# Patient Record
Sex: Male | Born: 1935 | Race: White | Hispanic: No | Marital: Married | Smoking: Former smoker
Health system: Southern US, Community
[De-identification: ages and names within clinical notes are randomized; demographics above are authoritative.]

## PROBLEM LIST (undated history)

## (undated) DIAGNOSIS — C801 Malignant (primary) neoplasm, unspecified: Secondary | ICD-10-CM

## (undated) DIAGNOSIS — I10 Essential (primary) hypertension: Secondary | ICD-10-CM

## (undated) HISTORY — PX: PACEMAKER INSERTION: SHX728

## (undated) HISTORY — PX: INSERT / REPLACE / REMOVE PACEMAKER: SUR710

---

## 2005-06-29 ENCOUNTER — Ambulatory Visit: Payer: Self-pay | Admitting: Internal Medicine

## 2005-07-14 ENCOUNTER — Ambulatory Visit: Payer: Self-pay | Admitting: Neurology

## 2005-07-17 ENCOUNTER — Ambulatory Visit: Payer: Self-pay | Admitting: Neurology

## 2006-02-22 ENCOUNTER — Ambulatory Visit: Payer: Self-pay | Admitting: Ophthalmology

## 2006-03-01 ENCOUNTER — Ambulatory Visit: Payer: Self-pay | Admitting: Ophthalmology

## 2006-03-16 ENCOUNTER — Ambulatory Visit: Payer: Self-pay | Admitting: Ophthalmology

## 2006-03-22 ENCOUNTER — Ambulatory Visit: Payer: Self-pay | Admitting: Ophthalmology

## 2008-09-07 DIAGNOSIS — C801 Malignant (primary) neoplasm, unspecified: Secondary | ICD-10-CM

## 2008-09-07 HISTORY — DX: Malignant (primary) neoplasm, unspecified: C80.1

## 2009-09-07 HISTORY — PX: OTHER SURGICAL HISTORY: SHX169

## 2009-11-05 ENCOUNTER — Ambulatory Visit: Payer: Self-pay | Admitting: Internal Medicine

## 2009-11-12 ENCOUNTER — Ambulatory Visit: Payer: Self-pay | Admitting: Internal Medicine

## 2009-12-06 ENCOUNTER — Ambulatory Visit: Payer: Self-pay | Admitting: Internal Medicine

## 2010-01-05 ENCOUNTER — Ambulatory Visit: Payer: Self-pay | Admitting: Internal Medicine

## 2010-02-05 ENCOUNTER — Ambulatory Visit: Payer: Self-pay | Admitting: Internal Medicine

## 2010-03-07 ENCOUNTER — Ambulatory Visit: Payer: Self-pay | Admitting: Internal Medicine

## 2010-04-01 ENCOUNTER — Ambulatory Visit: Payer: Self-pay | Admitting: Internal Medicine

## 2010-04-07 ENCOUNTER — Ambulatory Visit: Payer: Self-pay | Admitting: Internal Medicine

## 2010-05-08 ENCOUNTER — Ambulatory Visit: Payer: Self-pay | Admitting: Internal Medicine

## 2010-06-07 ENCOUNTER — Ambulatory Visit: Payer: Self-pay | Admitting: Internal Medicine

## 2010-07-08 ENCOUNTER — Ambulatory Visit: Payer: Self-pay | Admitting: Internal Medicine

## 2010-08-11 ENCOUNTER — Ambulatory Visit: Payer: Self-pay | Admitting: Internal Medicine

## 2010-09-07 ENCOUNTER — Ambulatory Visit: Payer: Self-pay | Admitting: Internal Medicine

## 2010-10-13 ENCOUNTER — Ambulatory Visit: Payer: Self-pay | Admitting: Internal Medicine

## 2010-11-06 ENCOUNTER — Ambulatory Visit: Payer: Self-pay | Admitting: Internal Medicine

## 2010-12-07 ENCOUNTER — Ambulatory Visit: Payer: Self-pay | Admitting: Internal Medicine

## 2011-01-02 ENCOUNTER — Ambulatory Visit: Payer: Self-pay | Admitting: Physician Assistant

## 2011-01-06 ENCOUNTER — Ambulatory Visit: Payer: Self-pay | Admitting: Internal Medicine

## 2011-02-12 ENCOUNTER — Ambulatory Visit: Payer: Self-pay | Admitting: Cardiology

## 2011-02-16 ENCOUNTER — Ambulatory Visit: Payer: Self-pay | Admitting: Radiation Oncology

## 2011-02-17 ENCOUNTER — Ambulatory Visit: Payer: Self-pay | Admitting: Cardiology

## 2011-02-26 ENCOUNTER — Ambulatory Visit: Payer: Self-pay | Admitting: Radiation Oncology

## 2011-03-08 ENCOUNTER — Ambulatory Visit: Payer: Self-pay | Admitting: Radiation Oncology

## 2013-03-08 ENCOUNTER — Ambulatory Visit: Payer: Self-pay | Admitting: Ophthalmology

## 2013-03-09 ENCOUNTER — Other Ambulatory Visit: Payer: Self-pay | Admitting: Ophthalmology

## 2013-03-09 LAB — SEDIMENTATION RATE: Erythrocyte Sed Rate: 2 mm/hr (ref 0–20)

## 2014-10-19 ENCOUNTER — Inpatient Hospital Stay: Payer: Self-pay | Admitting: Internal Medicine

## 2015-01-06 NOTE — H&P (Signed)
PATIENT NAME:  Marcus Cruz, Marcus Cruz MR#:  [id number] DATE OF BIRTH:  [birth year]  DATE OF ADMISSION:  10/19/2014   CHIEF COMPLAINT: Shortness of breath.   HISTORY OF PRESENT ILLNESS: A 81 year old Caucasian gentleman who is an extremely poor historian given tangential speech pattern, has history of essential hypertension, permanent pacemaker insertion, presenting with shortness of breath. Apparently, he has had shortness progressive over the last few weeks, with mainly as dyspnea on exertion. Denies any orthopnea, edema, or chest pain. Does have positive cough productive of clear sputum. No further symptomatology. No fevers, chills, or other symptoms. Upon arrival to the Emergency Department, noted to be hypoxic, saturations in the high 80s on room air with tachypnea.  REVIEW OF SYSTEMS: CONSTITUTIONAL: Denies fevers, chills, fatigue, weakness.  EYES: Denies blurred vision, double vision, or eye pain.  EARS AND NOSE AND THROAT: Denies tinnitus, ear pain, or hearing loss.  RESPIRATORY:  Positive for cough and shortness of breath as described above. CARDIOVASCULAR: Denies chest pain, palpitations, edema.  GASTROINTESTINAL: Denies nausea, vomiting, diarrhea, or abdominal pain.  GENITOURINARY:  Denies dysuria, hematuria. ENDOCRINE: Denies nocturia or thyroid problems.  HEMATOLOGIC AND LYMPHATIC: Denies easy bruising or bleeding.  SKIN: Denies rash or lesion.  MUSCULOSKELETAL: Denies pain in neck, back, shoulder, knees, hips or arthritic symptoms.  NEUROLOGIC: Denies paralysis or paresthesias. PSYCHIATRIC: Denies anxiety or depressive symptoms. Otherwise, full review of systems performed by me is negative.   PAST MEDICAL HISTORY: Includes essential hypertension, permanent pacemaker insertion as well as history of sarcoma status post treatment, chronic kidney disease, however unknown baseline.   SOCIAL HISTORY: Remote tobacco use. No alcohol or drug usage.   FAMILY HISTORY: Denies any known  cardiovascular problems or disorders.   ALLERGIES: No known drug allergies.   HOME MEDICATIONS: Include aspirin 81 mg p.o. daily, colchicine 0.6 mg p.o. 3 times daily as needed for gout, metoprolol succinate 25 mg p.o. daily, Lasix 20 mg p.o. daily.   PHYSICAL EXAMINATION: VITAL SIGNS: Temperature 99.1, heart rate 104, respirations 29, blood pressure 168/93, saturating 92% on supplemental O2, weight 80.3 kg, BMI 22.7.  GENERAL: Weak -appearing Caucasian gentleman, currently in no acute distress.  HEAD: Normocephalic, atraumatic.  EYES: Pupils equal, round, and  reactive to light. Extraocular muscles intact. No scleral icterus.  MOUTH: Moist mucosal membrane, dentition intact, no abscess noted. Marland Kitchen  EARS AND NOSE AND THROAT: Clear without exudates, no external lesions. NECK: Supple. No thyromegaly. No nodules. No JVD.  PULMONARY: Coarse breath sounds with scattered rhonchi mainly at the bases and more prominent on the right than on the left.  CHEST: Nontender to palpation.  CARDIOVASCULAR: S1, S2, regular rate and rhythm. No murmurs, rubs, or gallops. No edema. Pedal pulses 2+ bilaterally.  GASTROINTESTINAL: Soft, nontender, nondistended. No masses. Positive bowel sounds. No hepatosplenomegaly.  MUSCULOSKELETAL: No swelling, clubbing, or edema. Range of motion full in all extremities.  NEUROLOGIC: Cranial nerves II through XII intact. no gross focal neurological deficits, sensation intact, reflexes intact.   SKIN:  No ulceration, lesions, rashes, or cyanosis. Skin warm, dry, turgor intact.  PSYCHIATRIC: Mood and affect within normal range; awake, alert and oriented x 3 with tangential speech pattern. Insight and judgment intact.   DIAGNOSTIC DATA: EKG: Ventricular paced. Chest x-ray performed which reveals no acute cardiopulmonary process. Remainder of laboratory data: Sodium 139, potassium 4, chloride 106, bicarbonate 22, BUN 28, creatinine 1.93, glucose 150. Troponin is 0.07. WBC 19,  hemoglobin 15.4, and platelets are 97,000.   ASSESSMENT AND PLAN: A 81 year old  gentleman with history of essential hypertension, permanent pacemaker insertion and shortness of breath.   1. Acute on chronic hypoxic respiratory failure. Respiratory rates in the 30s. Oxygen saturations in the mid to high 80s on room air.  oxygen to keep saturations above  92%. DuoNeb treatments q.4 hours while she had azithromycin; however, has received Lasix in the Emergency Department. No further overt of congestive heart failure. Will hold further diuresis. Check a transthoracic echocardiogram.  2. Elevated troponin: Place on telemetry. Initiate aspirin and statin therapy. Trend cardiac enzymes x3; if upward trend we will initiate heparin drip.  3. Acute kidney injury on chronic kidney disease, unknown baseline but has elevated BUN. We will follow urine output and renal function. Will ask to hold on further diuresis at this time.  4. Hypertension. Continue  beta-blockade.  5. Deep vein thrombosis prophylaxis with heparin subcutaneously.   CODE STATUS: The patient is full code.   Time spent was 45 minutes.    ____________________________ Aaron Mose. Hower, MD dkh:dw D: 10/19/2014 20:58:35 ET T: 10/19/2014 21:13:50 ET JOB#: [id number]  cc: Aaron Mose. Hower, MD, <Dictator> DAVID Woodfin Ganja MD ELECTRONICALLY SIGNED 10/20/2014 2:12

## 2015-01-06 NOTE — Discharge Summary (Signed)
PATIENT NAME:  Marcus Cruz, APPLEGATE MR#:  [id number] DATE OF BIRTH:  Oct 24, 1920  DATE OF ADMISSION:  10/19/2014 DATE OF DISCHARGE:  10/24/2014  DISCHARGE DIAGNOSES: 1. Pneumonia, interstitial.  2. Acute respiratory failure, hypoxic, not previously on oxygen.  3. History of gout.   DISCHARGE MEDICATIONS: Per [facility] medication reconciliation sheet, but basically, he will be on his home medications and azithromycin 500 daily for 3 more days.   HISTORY AND PHYSICAL: Please see detailed history and physical done on admission.   HOSPITAL COURSE: The patient was admitted short of breath, weakness. Initial chest x-ray in the Emergency Room was unrevealing, and he was started on a Zithromax. Repeat chest x-ray several days later did show interstitial lung pneumonitis. White blood cell count was 19,000 on admission and increased gradually down to normal range and, in fact, was 8300 yesterday. He has known renal insufficiency that was worse on admission, with his GFR is at 35 on admission and increased with IV fluids, at 46 now. With this, was given a diagnosis of acute on chronic renal disease. He ambulated 300 feet yesterday with physical therapy. We will get him home health physical therapy, as he was weak and needed help do such and lives alone he will need nursing and physical therapy. I will see him in the office next week for further evaluation.   Note, it took approximately 35 minutes to do all discharge tasks today.   ____________________________ Ocie Cornfield. Ouida Sills, MD mwa:mw D: 10/24/2014 07:52:30 ET T: 10/24/2014 12:45:00 ET JOB#: [id number]  cc: Ocie Cornfield. Ouida Sills, MD, <Dictator> Kirk Ruths MD ELECTRONICALLY SIGNED 10/25/2014 8:11

## 2015-01-24 ENCOUNTER — Other Ambulatory Visit: Payer: Self-pay | Admitting: Urology

## 2015-01-24 DIAGNOSIS — R310 Gross hematuria: Secondary | ICD-10-CM

## 2015-01-30 ENCOUNTER — Ambulatory Visit
Admission: RE | Admit: 2015-01-30 | Discharge: 2015-01-30 | Disposition: A | Discharge status: Home / Self Care | Payer: Medicare Other | Source: Ambulatory Visit | Attending: Urology | Admitting: Urology

## 2015-01-30 DIAGNOSIS — R310 Gross hematuria: Secondary | ICD-10-CM | POA: Diagnosis present

## 2015-01-30 DIAGNOSIS — K4020 Bilateral inguinal hernia, without obstruction or gangrene, not specified as recurrent: Secondary | ICD-10-CM | POA: Diagnosis not present

## 2015-01-30 DIAGNOSIS — K449 Diaphragmatic hernia without obstruction or gangrene: Secondary | ICD-10-CM | POA: Diagnosis not present

## 2015-01-30 DIAGNOSIS — N281 Cyst of kidney, acquired: Secondary | ICD-10-CM | POA: Insufficient documentation

## 2015-01-30 DIAGNOSIS — K5900 Constipation, unspecified: Secondary | ICD-10-CM | POA: Insufficient documentation

## 2015-01-30 DIAGNOSIS — I714 Abdominal aortic aneurysm, without rupture: Secondary | ICD-10-CM | POA: Insufficient documentation

## 2015-01-30 DIAGNOSIS — K7689 Other specified diseases of liver: Secondary | ICD-10-CM | POA: Diagnosis not present

## 2015-01-30 HISTORY — DX: Essential (primary) hypertension: I10

## 2015-01-30 HISTORY — DX: Malignant (primary) neoplasm, unspecified: C80.1

## 2015-01-30 MED ORDER — IOHEXOL 300 MG/ML  SOLN
125.0000 mL | Freq: Once | INTRAMUSCULAR | Status: AC | PRN
  Administered 2015-01-30: 150 mL via INTRAVENOUS

## 2015-02-08 ENCOUNTER — Other Ambulatory Visit: Payer: Self-pay | Admitting: *Deleted

## 2015-02-08 DIAGNOSIS — N390 Urinary tract infection, site not specified: Secondary | ICD-10-CM

## 2015-02-08 DIAGNOSIS — R319 Hematuria, unspecified: Principal | ICD-10-CM

## 2015-02-08 MED ORDER — AMOXICILLIN-POT CLAVULANATE 875-125 MG PO TABS: 1.0000 | ORAL_TABLET | Freq: Two times a day (BID) | ORAL | Status: AC

## 2015-03-15 ENCOUNTER — Other Ambulatory Visit: Payer: Self-pay | Admitting: Urology

## 2017-03-04 ENCOUNTER — Inpatient Hospital Stay
Admission: EM | Admit: 2017-03-04 | Discharge: 2017-03-07 | DRG: 690 | Disposition: A | Discharge status: Home / Self Care | Payer: Medicare Other | Attending: Internal Medicine | Admitting: Internal Medicine

## 2017-03-04 ENCOUNTER — Emergency Department: Payer: Medicare Other

## 2017-03-04 ENCOUNTER — Encounter: Payer: Self-pay | Admitting: Emergency Medicine

## 2017-03-04 DIAGNOSIS — N10 Acute tubulo-interstitial nephritis: Secondary | ICD-10-CM | POA: Diagnosis not present

## 2017-03-04 DIAGNOSIS — Z8589 Personal history of malignant neoplasm of other organs and systems: Secondary | ICD-10-CM

## 2017-03-04 DIAGNOSIS — Z87891 Personal history of nicotine dependence: Secondary | ICD-10-CM | POA: Diagnosis not present

## 2017-03-04 DIAGNOSIS — D638 Anemia in other chronic diseases classified elsewhere: Secondary | ICD-10-CM | POA: Diagnosis present

## 2017-03-04 DIAGNOSIS — I129 Hypertensive chronic kidney disease with stage 1 through stage 4 chronic kidney disease, or unspecified chronic kidney disease: Secondary | ICD-10-CM | POA: Diagnosis present

## 2017-03-04 DIAGNOSIS — L988 Other specified disorders of the skin and subcutaneous tissue: Secondary | ICD-10-CM | POA: Diagnosis present

## 2017-03-04 DIAGNOSIS — Z7982 Long term (current) use of aspirin: Secondary | ICD-10-CM

## 2017-03-04 DIAGNOSIS — B9620 Unspecified Escherichia coli [E. coli] as the cause of diseases classified elsewhere: Secondary | ICD-10-CM | POA: Diagnosis present

## 2017-03-04 DIAGNOSIS — N400 Enlarged prostate without lower urinary tract symptoms: Secondary | ICD-10-CM | POA: Diagnosis present

## 2017-03-04 DIAGNOSIS — Z66 Do not resuscitate: Secondary | ICD-10-CM | POA: Diagnosis present

## 2017-03-04 DIAGNOSIS — D696 Thrombocytopenia, unspecified: Secondary | ICD-10-CM | POA: Diagnosis present

## 2017-03-04 DIAGNOSIS — R7881 Bacteremia: Secondary | ICD-10-CM | POA: Diagnosis present

## 2017-03-04 DIAGNOSIS — K5900 Constipation, unspecified: Secondary | ICD-10-CM | POA: Diagnosis present

## 2017-03-04 DIAGNOSIS — Z950 Presence of cardiac pacemaker: Secondary | ICD-10-CM

## 2017-03-04 DIAGNOSIS — R634 Abnormal weight loss: Secondary | ICD-10-CM | POA: Diagnosis present

## 2017-03-04 DIAGNOSIS — N12 Tubulo-interstitial nephritis, not specified as acute or chronic: Secondary | ICD-10-CM | POA: Diagnosis present

## 2017-03-04 DIAGNOSIS — R112 Nausea with vomiting, unspecified: Secondary | ICD-10-CM | POA: Diagnosis present

## 2017-03-04 DIAGNOSIS — N179 Acute kidney failure, unspecified: Secondary | ICD-10-CM | POA: Diagnosis present

## 2017-03-04 DIAGNOSIS — Z79899 Other long term (current) drug therapy: Secondary | ICD-10-CM

## 2017-03-04 DIAGNOSIS — Z8249 Family history of ischemic heart disease and other diseases of the circulatory system: Secondary | ICD-10-CM

## 2017-03-04 DIAGNOSIS — N183 Chronic kidney disease, stage 3 (moderate): Secondary | ICD-10-CM | POA: Diagnosis present

## 2017-03-04 LAB — COMPREHENSIVE METABOLIC PANEL
ALBUMIN: 3.7 g/dL (ref 3.5–5.0)
ALT: 9 U/L — ABNORMAL LOW (ref 17–63)
AST: 18 U/L (ref 15–41)
Alkaline Phosphatase: 70 U/L (ref 38–126)
Anion gap: 9 (ref 5–15)
BILIRUBIN TOTAL: 1.4 mg/dL — AB (ref 0.3–1.2)
BUN: 28 mg/dL — AB (ref 6–20)
CALCIUM: 8.8 mg/dL — AB (ref 8.9–10.3)
CO2: 24 mmol/L (ref 22–32)
Chloride: 104 mmol/L (ref 101–111)
Creatinine, Ser: 1.59 mg/dL — ABNORMAL HIGH (ref 0.61–1.24)
GFR calc Af Amer: 41 mL/min — ABNORMAL LOW (ref >60)
GFR, EST NON AFRICAN AMERICAN: 35 mL/min — AB (ref >60)
GLUCOSE: 129 mg/dL — AB (ref 65–99)
Potassium: 4.1 mmol/L (ref 3.5–5.1)
Sodium: 137 mmol/L (ref 135–145)
TOTAL PROTEIN: 6.7 g/dL (ref 6.5–8.1)

## 2017-03-04 LAB — URINALYSIS, COMPLETE (UACMP) WITH MICROSCOPIC
Bacteria, UA: NONE SEEN
Bilirubin Urine: NEGATIVE
GLUCOSE, UA: NEGATIVE mg/dL
Ketones, ur: NEGATIVE mg/dL
NITRITE: NEGATIVE
PH: 5 (ref 5.0–8.0)
PROTEIN: 100 mg/dL — AB
Specific Gravity, Urine: 1.024 (ref 1.005–1.030)
Squamous Epithelial / LPF: NONE SEEN

## 2017-03-04 LAB — CBC
HCT: 36.5 % — ABNORMAL LOW (ref 40.0–52.0)
Hemoglobin: 12.6 g/dL — ABNORMAL LOW (ref 13.0–18.0)
MCH: 30.7 pg (ref 26.0–34.0)
MCHC: 34.5 g/dL (ref 32.0–36.0)
MCV: 88.9 fL (ref 80.0–100.0)
Platelets: 104 10*3/uL — ABNORMAL LOW (ref 150–440)
RBC: 4.1 MIL/uL — ABNORMAL LOW (ref 4.40–5.90)
RDW: 15.1 % — AB (ref 11.5–14.5)
WBC: 7.6 10*3/uL (ref 3.8–10.6)

## 2017-03-04 LAB — LIPASE, BLOOD: Lipase: 23 U/L (ref 11–51)

## 2017-03-04 LAB — LACTIC ACID, PLASMA: Lactic Acid, Venous: 1.2 mmol/L (ref 0.5–1.9)

## 2017-03-04 MED ORDER — ACETAMINOPHEN 325 MG PO TABS
650.0000 mg | ORAL_TABLET | Freq: Four times a day (QID) | ORAL | Status: DC | PRN
  Administered 2017-03-05: 650 mg via ORAL
  Filled 2017-03-04: qty 2

## 2017-03-04 MED ORDER — METOPROLOL SUCCINATE ER 25 MG PO TB24
25.0000 mg | ORAL_TABLET | Freq: Every day | ORAL | Status: DC
  Administered 2017-03-05 – 2017-03-07 (×3): 25 mg via ORAL
  Filled 2017-03-04 (×3): qty 1

## 2017-03-04 MED ORDER — TAMSULOSIN HCL 0.4 MG PO CAPS
0.4000 mg | ORAL_CAPSULE | Freq: Every day | ORAL | Status: DC
  Administered 2017-03-04 – 2017-03-07 (×4): 0.4 mg via ORAL
  Filled 2017-03-04 (×4): qty 1

## 2017-03-04 MED ORDER — IOPAMIDOL (ISOVUE-300) INJECTION 61%: 30.0000 mL | Freq: Once | INTRAVENOUS | Status: DC

## 2017-03-04 MED ORDER — ENOXAPARIN SODIUM 40 MG/0.4ML ~~LOC~~ SOLN: 40.0000 mg | SUBCUTANEOUS | Status: DC

## 2017-03-04 MED ORDER — ASPIRIN EC 81 MG PO TBEC
81.0000 mg | DELAYED_RELEASE_TABLET | Freq: Every day | ORAL | Status: DC
  Administered 2017-03-05 – 2017-03-07 (×3): 81 mg via ORAL
  Filled 2017-03-04 (×3): qty 1

## 2017-03-04 MED ORDER — POLYETHYLENE GLYCOL 3350 17 G PO PACK
17.0000 g | PACK | Freq: Every day | ORAL | Status: DC
  Administered 2017-03-04 – 2017-03-07 (×4): 17 g via ORAL
  Filled 2017-03-04 (×4): qty 1

## 2017-03-04 MED ORDER — ACETAMINOPHEN 650 MG RE SUPP
650.0000 mg | Freq: Four times a day (QID) | RECTAL | Status: DC | PRN
Start: 2017-03-04 — End: 2017-03-07

## 2017-03-04 MED ORDER — ENOXAPARIN SODIUM 30 MG/0.3ML ~~LOC~~ SOLN
30.0000 mg | Freq: Every day | SUBCUTANEOUS | Status: DC
  Administered 2017-03-04 – 2017-03-06 (×3): 30 mg via SUBCUTANEOUS
  Filled 2017-03-04 (×3): qty 0.3

## 2017-03-04 MED ORDER — ONDANSETRON HCL 4 MG/2ML IJ SOLN
4.0000 mg | Freq: Once | INTRAMUSCULAR | Status: AC
  Administered 2017-03-04: 4 mg via INTRAVENOUS
  Filled 2017-03-04: qty 2

## 2017-03-04 MED ORDER — DEXTROSE 5 % IV SOLN
1.0000 g | Freq: Once | INTRAVENOUS | Status: AC
  Administered 2017-03-04: 1 g via INTRAVENOUS
  Filled 2017-03-04: qty 10

## 2017-03-04 MED ORDER — IOPAMIDOL (ISOVUE-300) INJECTION 61%
100.0000 mL | Freq: Once | INTRAVENOUS | Status: AC | PRN
  Administered 2017-03-04: 80 mL via INTRAVENOUS

## 2017-03-04 MED ORDER — BISACODYL 10 MG RE SUPP: 10.0000 mg | Freq: Every day | RECTAL | Status: DC | PRN

## 2017-03-04 MED ORDER — SODIUM CHLORIDE 0.9 % IV SOLN
INTRAVENOUS | Status: DC
  Administered 2017-03-04: 23:00:00 via INTRAVENOUS

## 2017-03-04 MED ORDER — DEXTROSE 5 % IV SOLN
1.0000 g | Freq: Every day | INTRAVENOUS | Status: DC
  Administered 2017-03-05: 1 g via INTRAVENOUS
  Filled 2017-03-04 (×3): qty 10

## 2017-03-04 MED ORDER — SODIUM CHLORIDE 0.9 % IV BOLUS (SEPSIS)
1000.0000 mL | Freq: Once | INTRAVENOUS | Status: AC
  Administered 2017-03-04: 1000 mL via INTRAVENOUS

## 2017-03-04 MED ORDER — ONDANSETRON HCL 4 MG/2ML IJ SOLN: 4.0000 mg | Freq: Four times a day (QID) | INTRAMUSCULAR | Status: DC | PRN

## 2017-03-04 MED ORDER — ONDANSETRON HCL 4 MG PO TABS: 4.0000 mg | ORAL_TABLET | Freq: Four times a day (QID) | ORAL | Status: DC | PRN

## 2017-03-04 NOTE — ED Triage Notes (Signed)
Patient presents to the ED with vomiting x 2.5 weeks.  Patient reports the vomiting began after patient started taking an antibiotic for his UTI.  Patient went to his PCP and they changed patient's antibiotics but patient has been continuing to have vomiting.  Patient reports vomiting approx. 7 times in the past 24 hours.  Patient denies diarrhea and abdominal pain.  Patient reports some constipation.  Patient reports having a bm yesterday--patient states, "I had to dig it out."

## 2017-03-04 NOTE — ED Notes (Signed)
Placed patient on 2 L [place] due to O2 at 90-92% RA. Now 96% on 2L

## 2017-03-04 NOTE — H&P (Addendum)
[place] [facility] [place] NAME: Marcus Cruz    MR#:  [id number]  DATE OF BIRTH:  Nov 13, 1920  DATE OF ADMISSION:  03/04/2017  PRIMARY CARE PHYSICIAN: Kirk Ruths, MD   REQUESTING/REFERRING PHYSICIAN: Dr Shirlyn Goltz  CHIEF COMPLAINT:   Chief Complaint  Patient presents with  . Emesis  . Weakness    HISTORY OF PRESENT ILLNESS:  Marcus Cruz  is a 83 y.o. male with a known history of Hypertension presents to the hospital with not feeling well for the last 2 weeks. He previously went to a walk-in clinic and was taking Augmentin for urinary tract infection. Over the last for 5 days he has been having nausea vomiting. He's been feeling very weak. He's been feeling cold. No fever or chills. He's had some weight loss from 156 down 149 pounds.  Hospitalist services were contacted for admission for pyelonephritis.  PAST MEDICAL HISTORY:   Past Medical History:  Diagnosis Date  . Cancer [facility]) 2010   sarcoma resected from Left thigh.  . Hypertension     PAST SURGICAL HISTORY:   Past Surgical History:  Procedure Laterality Date  . INSERT / REPLACE / REMOVE PACEMAKER    . PACEMAKER INSERTION    . sarcoma surgery Left 2011    SOCIAL HISTORY:   Social History  Substance Use Topics  . Smoking status: Former Smoker    Quit date: 2008  . Smokeless tobacco: Never Used  . Alcohol use Yes     Comment: rarely    FAMILY HISTORY:   Family History  Problem Relation Age of Onset  . CAD Father     DRUG ALLERGIES:  No Known Allergies  REVIEW OF SYSTEMS:  CONSTITUTIONAL: No fever. Positive for weakness. Positive for weight loss EYES: No blurred or double vision. Wears glasses EARS, NOSE, AND THROAT: No tinnitus or ear pain. No sore throat. Constant runny nose. Decreased hearing. RESPIRATORY: No cough, shortness of breath, wheezing or hemoptysis.  CARDIOVASCULAR: No chest pain, orthopnea, edema.  GASTROINTESTINAL: Positive for  nausea and vomiting. No diarrhea or abdominal pain. No blood in bowel movements. Positive for constipation GENITOURINARY: No dysuria, hematuria. Sometimes has trouble getting his urine out ENDOCRINE: No polyuria, nocturia,  HEMATOLOGY: No anemia, easy bruising or bleeding SKIN: No rash or lesion. MUSCULOSKELETAL: No joint pain or arthritis.   NEUROLOGIC: No tingling, numbness, weakness.  PSYCHIATRY: No anxiety or depression.   MEDICATIONS AT HOME:   Prior to Admission medications   Medication Sig Start Date End Date Taking? Authorizing Provider  aspirin EC 81 MG tablet Take 81 mg by mouth daily.   Yes [provider]  furosemide (LASIX) 20 MG tablet Take 20 mg by mouth daily. 03/03/17  Yes [provider]  metoprolol succinate (TOPROL-XL) 25 MG 24 hr tablet Take 25 mg by mouth daily. 03/03/17  Yes [provider]      VITAL SIGNS:  Blood pressure (!) 141/57, pulse 84, temperature 99.1 F (37.3 C), temperature source Oral, resp. rate 18, height 6' 2.5" (1.892 m), weight 67.6 kg (149 lb), SpO2 94 %.  PHYSICAL EXAMINATION:  GENERAL:  83 y.o.-year-old patient lying in the bed with no acute distress.  EYES: Pupils equal, round, reactive to light and accommodation. No scleral icterus. Extraocular muscles intact.  HEENT: Head atraumatic, normocephalic. Oropharynx and nasopharynx clear.  NECK:  Supple, no jugular venous distention. No thyroid enlargement, no tenderness.  LUNGS: Normal breath sounds bilaterally, no wheezing, rales,rhonchi or  crepitation. No use of accessory muscles of respiration.  CARDIOVASCULAR: S1, S2 normal. No murmurs, rubs, or gallops.  ABDOMEN: Soft, nontender, nondistended. Bowel sounds present. No organomegaly or mass. No CVA tenderness EXTREMITIES: No pedal edema, cyanosis, or clubbing.  NEUROLOGIC: Cranial nerves II through XII are intact except for decreased hearing. Muscle strength 5/5 in all extremities. Sensation intact. Gait not  checked.  PSYCHIATRIC: The patient is alert and oriented x 3.  SKIN: Left cheek he does have an area that looks suspicious for basal cell skin cancer  LABORATORY PANEL:   CBC  Recent Labs Lab 03/04/17 1432  WBC 7.6  HGB 12.6*  HCT 36.5*  PLT 104*   ------------------------------------------------------------------------------------------------------------------  Chemistries   Recent Labs Lab 03/04/17 1432  NA 137  K 4.1  CL 104  CO2 24  GLUCOSE 129*  BUN 28*  CREATININE 1.59*  CALCIUM 8.8*  AST 18  ALT 9*  ALKPHOS 70  BILITOT 1.4*   ------------------------------------------------------------------------------------------------------------------   RADIOLOGY:  Dg Chest 2 View  Result Date: 03/04/2017 CLINICAL DATA:  Vomiting for the past 2.5 weeks.  Ex-smoker. EXAM: CHEST  2 VIEW COMPARISON:  10/22/2014. FINDINGS: Normal sized heart with an interval decrease in size. Tortuous and partially calcified thoracic aorta. Stable mildly elevated right hemidiaphragm. Mild hyperexpansion of the lungs with mild central peribronchial thickening. Thoracic spine degenerative changes. Diffuse osteopenia. Stable left subclavian pacemaker leads. IMPRESSION: No acute abnormality.  Mild changes of COPD and chronic bronchitis. Electronically Signed   By: Claudie Revering M.D.   On: 03/04/2017 17:12   Ct Abdomen Pelvis W Contrast  Result Date: 03/04/2017 CLINICAL DATA:  2-1/2 week history of vomiting. EXAM: CT ABDOMEN AND PELVIS WITH CONTRAST TECHNIQUE: Multidetector CT imaging of the abdomen and pelvis was performed using the standard protocol following bolus administration of intravenous contrast. CONTRAST:  33mL ISOVUE-300 IOPAMIDOL (ISOVUE-300) INJECTION 61% COMPARISON:  01/30/2015 FINDINGS: Lower chest:  Emphysema with basilar atelectasis or scarring. Hepatobiliary: Multiple hepatic cysts of varying sizes again noted. A dominant cyst in the posterior right liver on the previous study shows  interval involution. There is no evidence for gallstones, gallbladder wall thickening, or pericholecystic fluid. No intrahepatic or extrahepatic biliary dilation. Pancreas: No focal mass lesion. No dilatation of the main duct. No intraparenchymal cyst. No peripancreatic edema. Spleen: No splenomegaly. No focal mass lesion. Adrenals/Urinary Tract: No adrenal nodule or mass. Cortical atrophy noted in both kidneys. Subtle area of altered segmental perfusion in the lateral aspect of the interpolar right kidney (image 12 series 7). 2.8 cm exophytic lesion in the interpolar left kidney has attenuation too high to represent a simple cyst but is unchanged since 01/30/2015 suggesting benign etiology such as cyst complicated by proteinaceous debris or hemorrhage. 19 mm exophytic lesion posterior left interpolar kidney was 17 mm previously. Small exophytic lesions lower pole right kidney are stable. No hydroureteronephrosis. No evidence for hydroureter. Bladder is distended with stable appearance of a polypoid nodular lesion along the wall of the bladder base, at an apparent TURP defect. This nodular area is stable since the prior study. Stomach/Bowel: Small hiatal hernia. Stomach is nondistended. No gastric wall thickening. No evidence of outlet obstruction. Duodenum is normally positioned as is the ligament of Treitz. No small bowel wall thickening. No small bowel dilatation. The terminal ileum is normal. The appendix is normal. Diverticular changes are noted in the left colon without evidence of diverticulitis. Short segment proximal sigmoid colon extends into a left groin hernia without evidence for obstruction or incarceration  by CT. Vascular/Lymphatic: Atherosclerotic calcification noted in the wall of the abdominal aorta which measures up to 3.3 cm maximum infrarenal diameter. There is no gastrohepatic or hepatoduodenal ligament lymphadenopathy. No intraperitoneal or retroperitoneal lymphadenopathy. No pelvic sidewall  lymphadenopathy. Reproductive: The prostate gland and seminal vesicles have normal imaging features. Other: No intraperitoneal free fluid. Musculoskeletal: As mentioned above, left groin hernia contains short segment proximal sigmoid colon without complicating features. Right groin hernia contains fat and a trace amount of fluid. Small sclerotic focus in the upper sacrum is stable. Bone windows reveal no worrisome lytic or sclerotic osseous lesions. Mild superior endplate compression deformity is evident at T12. IMPRESSION: 1. Focal area of altered perfusion in the interpolar right kidney suggests segmental edema. Pyelonephritis is suspected. 2. Atherosclerotic infrarenal fusiform abdominal aortic aneurysm measuring up to 3.3 cm. Recommend followup by ultrasound in 3 years. This recommendation follows ACR consensus guidelines: White Paper of the ACR Incidental Findings Committee II on Vascular Findings. J Am Coll Radiol 2013; 10:789-794. 3. Bilateral groin hernias. Left groin hernia contains short segment sigmoid colon without complicating features. Right groin hernia contains fat and a trace amount of free fluid. 4. Hepatic and renal cysts. Electronically Signed   By: Misty Stanley M.D.   On: 03/04/2017 18:32     IMPRESSION AND PLAN:   1. Acute pyelonephritis. IV Rocephin ordered. Follow-up urine culture. IV fluid hydration 2. Acute kidney injury on chronic kidney disease stage III. Monitor closely with gentle IV fluid hydration 3. BPH. Start Flomax. 4. Essential hypertension. Continue Toprol and hold Lasix. 5. Nausea and vomiting. When necessary Zofran 6. Thrombocytopenia. Looks chronic looking back at the current total clinic labs. Check a hepatitis C profile. 7. Lesion on his left face that is suspicious for basal cell skin cancer. Recommend dermatology appointment as outpatient once infection treated 8. Constipation give MiraLAX and Dulcolax suppositories as needed    All the records are  reviewed and case discussed with ED provider. Management plans discussed with the patient, family and they are in agreement.  CODE STATUS: DO NOT RESUSCITATE  TOTAL TIME TAKING CARE OF THIS PATIENT: 55 minutes.    Loletha Grayer M.D on 03/04/2017 at 8:33 PM  Between 7am to 6pm - Pager - [phone]  After 6pm call admission pager [phone]  [facility]  [phone]  CC: Primary care physician; Kirk Ruths, MD

## 2017-03-04 NOTE — ED Notes (Signed)
Patient reports since starting the antibiotic he has been vomiting. Patient st he cannot keep anything down for the last 2 days.

## 2017-03-04 NOTE — Progress Notes (Signed)
Lovenox changed to 30 mg daily for BMI <40 and CrCl <30 

## 2017-03-04 NOTE — ED Notes (Signed)
Patient has not vomited since being back in room ED13.

## 2017-03-04 NOTE — ED Notes (Signed)
Patient was brought to the ED by [facility].  Per staff patient started on augmentin on June 8th for a UTI and it caused him to have vomiting.  Patient has continued to take the augmentin and reports he has one dose left but he has continued to have vomiting and patient told his family today he was ready to go to the doctor.  EMS came to patient's home today and instructed patient to go to the ED and patient went to the walk-in clinic.

## 2017-03-04 NOTE — ED Provider Notes (Signed)
[place] Provider Note   CSN: [id number] Arrival date & time: 03/04/17  1334     History   Chief Complaint Chief Complaint  Patient presents with  . Emesis  . Weakness    HPI Marcus Cruz. is a 83 y.o. 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 Patient was diagnosed with urinary tract infection about 2 weeks ago and was started on Augmentin. Patient states that he has been having poor appetite for the last 2 weeks. About 10 pounds over the last 2 weeks. States that he was unable to keep anything down for the last 2 days and has been having persistent vomiting. He has some chills but no actual fever. Patient has been constipated but that is baseline for him. A perform digital rectal disimpaction himself yesterday was able to have a bowel movement. Has diffuse weakness but denies any productive cough.  The history is provided by the patient.    Past Medical History:  Diagnosis Date  . 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  . Hypertension     There are no active problems to display for this patient.   Past Surgical History:  Procedure Laterality Date  . PACEMAKER INSERTION    . sarcoma surgery Left 2011       Home Medications    Prior to Admission medications   Not on File    Family History No family history on file.  Social History Social History  Substance Use Topics  . Smoking status: Former Smoker    Quit date: 2008  . Smokeless tobacco: Never Used  . Alcohol use Yes     Comment: rarely     Allergies   Patient has no known allergies.   Review of Systems Review of Systems  Constitutional: Positive for chills.  Gastrointestinal: Positive for abdominal pain, constipation and vomiting.  Genitourinary: Positive for decreased urine volume.  Neurological: Positive for weakness.  All other systems reviewed and are negative.    Physical  Exam Updated Vital Signs BP (!) 141/57 (BP Location: Right Arm)   Pulse 84   Temp 99.1 F (37.3 C) (Oral)   Resp 18   Ht 6' 2.5" (1.892 m)   Wt 67.6 kg (149 lb)   SpO2 94%   BMI 18.87 kg/m   Physical Exam  Constitutional: He is oriented to person, place, and time.  Uncomfortable, dehydrated   HENT:  Head: Normocephalic.  MM slightly dry   Eyes: Conjunctivae and EOM are normal. Pupils are equal, round, and reactive to light.  Neck: Normal range of motion. Neck supple.  Cardiovascular: Normal rate, regular rhythm and normal heart sounds.   Pulmonary/Chest: Effort normal.  Crackles R base   Abdominal: Soft. Bowel sounds are normal.  Mild R CVAT   Genitourinary:  Genitourinary Comments: Rectal- no obvious stool impaction   Musculoskeletal: Normal range of motion. He exhibits no edema.  Neurological: He is alert and oriented to person, place, and time. No cranial nerve deficit.  Skin: Skin is warm.  Psychiatric: He has a normal mood and affect.  Nursing note and vitals reviewed.    ED Treatments / Results  Labs (all labs ordered are listed, but only abnormal results are displayed) Labs Reviewed  COMPREHENSIVE METABOLIC PANEL - Abnormal; Notable for the following:       Result Value   Glucose, Bld 129 (*)    BUN 28 (*)  Creatinine, Ser 1.59 (*)    Calcium 8.8 (*)    ALT 9 (*)    Total Bilirubin 1.4 (*)    GFR calc non Af Amer 35 (*)    GFR calc Af Amer 41 (*)    All other components within normal limits  CBC - Abnormal; Notable for the following:    RBC 4.10 (*)    Hemoglobin 12.6 (*)    HCT 36.5 (*)    RDW 15.1 (*)    Platelets 104 (*)    All other components within normal limits  URINALYSIS, COMPLETE (UACMP) WITH MICROSCOPIC - Abnormal; Notable for the following:    Color, Urine YELLOW (*)    APPearance TURBID (*)    Hgb urine dipstick MODERATE (*)    Protein, ur 100 (*)    Leukocytes, UA LARGE (*)    All other components within normal limits  CULTURE,  BLOOD (ROUTINE X 2)  CULTURE, BLOOD (ROUTINE X 2)  LIPASE, BLOOD  LACTIC ACID, PLASMA    EKG  EKG Interpretation None       Radiology Dg Chest 2 View  Result Date: 03/04/2017 CLINICAL DATA:  Vomiting for the past 2.5 weeks.  Ex-smoker. EXAM: CHEST  2 VIEW COMPARISON:  10/22/2014. FINDINGS: Normal sized heart with an interval decrease in size. Tortuous and partially calcified thoracic aorta. Stable mildly elevated right hemidiaphragm. Mild hyperexpansion of the lungs with mild central peribronchial thickening. Thoracic spine degenerative changes. Diffuse osteopenia. Stable left subclavian pacemaker leads. IMPRESSION: No acute abnormality.  Mild changes of COPD and chronic bronchitis. Electronically Signed   By: Claudie Revering M.D.   On: 03/04/2017 17:12   Ct Abdomen Pelvis W Contrast  Result Date: 03/04/2017 CLINICAL DATA:  2-1/2 week history of vomiting. EXAM: CT ABDOMEN AND PELVIS WITH CONTRAST TECHNIQUE: Multidetector CT imaging of the abdomen and pelvis was performed using the standard protocol following bolus administration of intravenous contrast. CONTRAST:  64mL ISOVUE-300 IOPAMIDOL (ISOVUE-300) INJECTION 61% COMPARISON:  01/30/2015 FINDINGS: Lower chest:  Emphysema with basilar atelectasis or scarring. Hepatobiliary: Multiple hepatic cysts of varying sizes again noted. A dominant cyst in the posterior right liver on the previous study shows interval involution. There is no evidence for gallstones, gallbladder wall thickening, or pericholecystic fluid. No intrahepatic or extrahepatic biliary dilation. Pancreas: No focal mass lesion. No dilatation of the main duct. No intraparenchymal cyst. No peripancreatic edema. Spleen: No splenomegaly. No focal mass lesion. Adrenals/Urinary Tract: No adrenal nodule or mass. Cortical atrophy noted in both kidneys. Subtle area of altered segmental perfusion in the lateral aspect of the interpolar right kidney (image 12 series 7). 2.8 cm exophytic lesion in  the interpolar left kidney has attenuation too high to represent a simple cyst but is unchanged since 01/30/2015 suggesting benign etiology such as cyst complicated by proteinaceous debris or hemorrhage. 19 mm exophytic lesion posterior left interpolar kidney was 17 mm previously. Small exophytic lesions lower pole right kidney are stable. No hydroureteronephrosis. No evidence for hydroureter. Bladder is distended with stable appearance of a polypoid nodular lesion along the wall of the bladder base, at an apparent TURP defect. This nodular area is stable since the prior study. Stomach/Bowel: Small hiatal hernia. Stomach is nondistended. No gastric wall thickening. No evidence of outlet obstruction. Duodenum is normally positioned as is the ligament of Treitz. No small bowel wall thickening. No small bowel dilatation. The terminal ileum is normal. The appendix is normal. Diverticular changes are noted in the left colon without evidence of  diverticulitis. Short segment proximal sigmoid colon extends into a left groin hernia without evidence for obstruction or incarceration by CT. Vascular/Lymphatic: Atherosclerotic calcification noted in the wall of the abdominal aorta which measures up to 3.3 cm maximum infrarenal diameter. There is no gastrohepatic or hepatoduodenal ligament lymphadenopathy. No intraperitoneal or retroperitoneal lymphadenopathy. No pelvic sidewall lymphadenopathy. Reproductive: The prostate gland and seminal vesicles have normal imaging features. Other: No intraperitoneal free fluid. Musculoskeletal: As mentioned above, left groin hernia contains short segment proximal sigmoid colon without complicating features. Right groin hernia contains fat and a trace amount of fluid. Small sclerotic focus in the upper sacrum is stable. Bone windows reveal no worrisome lytic or sclerotic osseous lesions. Mild superior endplate compression deformity is evident at T12. IMPRESSION: 1. Focal area of altered  perfusion in the interpolar right kidney suggests segmental edema. Pyelonephritis is suspected. 2. Atherosclerotic infrarenal fusiform abdominal aortic aneurysm measuring up to 3.3 cm. Recommend followup by ultrasound in 3 years. This recommendation follows ACR consensus guidelines: White Paper of the ACR Incidental Findings Committee II on Vascular Findings. J Am Coll Radiol 2013; 10:789-794. 3. Bilateral groin hernias. Left groin hernia contains short segment sigmoid colon without complicating features. Right groin hernia contains fat and a trace amount of free fluid. 4. Hepatic and renal cysts. Electronically Signed   By: Misty Stanley M.D.   On: 03/04/2017 18:32    Procedures Procedures (including critical care time)  Medications Ordered in ED Medications  iopamidol (ISOVUE-300) 61 % injection 30 mL (not administered)  cefTRIAXone (ROCEPHIN) 1 g in dextrose 5 % 50 mL IVPB (not administered)  sodium chloride 0.9 % bolus 1,000 mL (1,000 mLs Intravenous New Bag/Given 03/04/17 1639)  ondansetron (ZOFRAN) injection 4 mg (4 mg Intravenous Given 03/04/17 1639)  iopamidol (ISOVUE-300) 61 % injection 100 mL (80 mLs Intravenous Contrast Given 03/04/17 1759)     Initial Impression / Assessment and Plan / ED Course  I have reviewed the triage vital signs and the nursing notes.  Pertinent labs & imaging results that were available during my care of the patient were reviewed by me and considered in my medical decision making (see chart for details).     Marcus Cruz. is a 83 y.o. male here with weight loss, chills, abdominal pain, vomiting. Consider pyelo vs pneumonia vs progressive sarcoma. Will get labs, UA, lactate, cultures, CXR, CT ab/pel. Will hydrate patient.   7:58 PM UA + large leuk and too many to count WBC. CT showed R pyelo. No urine culture in the system. Urine and blood cultures sent. Lactate nl. Given rocephin. Hospitalist to admit for pyelo with failure of outpatient abx.   Final  Clinical Impressions(s) / ED Diagnoses   Final diagnoses:  None    New Prescriptions New Prescriptions   No medications on file     Drenda Freeze, MD 03/04/17 [phone]

## 2017-03-05 LAB — BLOOD CULTURE ID PANEL (REFLEXED)
Acinetobacter baumannii: NOT DETECTED
CANDIDA PARAPSILOSIS: NOT DETECTED
CANDIDA TROPICALIS: NOT DETECTED
CARBAPENEM RESISTANCE: NOT DETECTED
Candida albicans: NOT DETECTED
Candida glabrata: NOT DETECTED
Candida krusei: NOT DETECTED
ENTEROBACTERIACEAE SPECIES: DETECTED — AB
Enterobacter cloacae complex: NOT DETECTED
Enterococcus species: NOT DETECTED
Escherichia coli: DETECTED — AB
HAEMOPHILUS INFLUENZAE: NOT DETECTED
KLEBSIELLA PNEUMONIAE: NOT DETECTED
Klebsiella oxytoca: NOT DETECTED
Listeria monocytogenes: NOT DETECTED
Neisseria meningitidis: NOT DETECTED
PSEUDOMONAS AERUGINOSA: NOT DETECTED
Proteus species: NOT DETECTED
STAPHYLOCOCCUS AUREUS BCID: NOT DETECTED
STREPTOCOCCUS PYOGENES: NOT DETECTED
STREPTOCOCCUS SPECIES: NOT DETECTED
Serratia marcescens: NOT DETECTED
Staphylococcus species: NOT DETECTED
Streptococcus agalactiae: NOT DETECTED
Streptococcus pneumoniae: NOT DETECTED

## 2017-03-05 LAB — BASIC METABOLIC PANEL
Anion gap: 7 (ref 5–15)
BUN: 25 mg/dL — AB (ref 6–20)
CO2: 24 mmol/L (ref 22–32)
CREATININE: 1.63 mg/dL — AB (ref 0.61–1.24)
Calcium: 7.7 mg/dL — ABNORMAL LOW (ref 8.9–10.3)
Chloride: 107 mmol/L (ref 101–111)
GFR calc Af Amer: 40 mL/min — ABNORMAL LOW (ref >60)
GFR, EST NON AFRICAN AMERICAN: 34 mL/min — AB (ref >60)
Glucose, Bld: 113 mg/dL — ABNORMAL HIGH (ref 65–99)
POTASSIUM: 4.2 mmol/L (ref 3.5–5.1)
Sodium: 138 mmol/L (ref 135–145)

## 2017-03-05 LAB — CBC
HEMATOCRIT: 30.8 % — AB (ref 40.0–52.0)
Hemoglobin: 10.7 g/dL — ABNORMAL LOW (ref 13.0–18.0)
MCH: 30.1 pg (ref 26.0–34.0)
MCHC: 34.7 g/dL (ref 32.0–36.0)
MCV: 86.8 fL (ref 80.0–100.0)
PLATELETS: 92 10*3/uL — AB (ref 150–440)
RBC: 3.55 MIL/uL — ABNORMAL LOW (ref 4.40–5.90)
RDW: 15.3 % — AB (ref 11.5–14.5)
WBC: 6.6 10*3/uL (ref 3.8–10.6)

## 2017-03-05 MED ORDER — ENSURE ENLIVE PO LIQD
237.0000 mL | Freq: Two times a day (BID) | ORAL | Status: DC
  Administered 2017-03-05 – 2017-03-06 (×3): 237 mL via ORAL

## 2017-03-05 NOTE — Progress Notes (Signed)
[place] [facility] [place] NAME: Marcus Cruz    MR#:  [id number]  DATE OF BIRTH:  21-May-1921  SUBJECTIVE:   Had 1450 out from catheter this am Feels much better  REVIEW OF SYSTEMS:    Review of Systems  Constitutional: Negative for fever, chills weight loss HENT: Negative for ear pain, nosebleeds, congestion, facial swelling, rhinorrhea, neck pain, neck stiffness and ear discharge.   Respiratory: Negative for cough, shortness of breath, wheezing  Cardiovascular: Negative for chest pain, palpitations and leg swelling.  Gastrointestinal: Negative for heartburn, abdominal pain, vomiting, diarrhea or consitpation Genitourinary: Negative for dysuria, urgency, frequency, hematuria Musculoskeletal: Negative for back pain or joint pain Neurological: Negative for dizziness, seizures, syncope, focal weakness,  numbness and headaches.  Hematological: Does not bruise/bleed easily.  Psychiatric/Behavioral: Negative for hallucinations, confusion, dysphoric mood    Tolerating Diet: yes      DRUG ALLERGIES:  No Known Allergies  VITALS:  Blood pressure (!) 129/52, pulse 85, temperature 99.3 F (37.4 C), temperature source Oral, resp. rate (!) 22, height 6\' 2"  (1.88 m), weight 70.4 kg (155 lb 3.2 oz), SpO2 93 %.  PHYSICAL EXAMINATION:  Constitutional: Appears well-developed and well-nourished. No distress. HENT: Normocephalic. Marland Kitchen Oropharynx is clear and moist.  Eyes: Conjunctivae and EOM are normal. PERRLA, no scleral icterus.  Neck: Normal ROM. Neck supple. No JVD. No tracheal deviation. CVS: RRR, S1/S2 +, no murmurs, no gallops, no carotid bruit.  Pulmonary: Effort and breath sounds normal, no stridor, rhonchi, wheezes, rales.  Abdominal: Soft. BS +,  no distension, tenderness, rebound or guarding.  Musculoskeletal: Normal range of motion. No edema and no tenderness.  Neuro: Alert. CN 2-12 grossly intact. No focal deficits. Skin: Skin is warm and  dry. No rash noted. Psychiatric: Normal mood and affect.      LABORATORY PANEL:   CBC  Recent Labs Lab 03/05/17 0411  WBC 6.6  HGB 10.7*  HCT 30.8*  PLT 92*   ------------------------------------------------------------------------------------------------------------------  Chemistries   Recent Labs Lab 03/04/17 1432 03/05/17 0411  NA 137 138  K 4.1 4.2  CL 104 107  CO2 24 24  GLUCOSE 129* 113*  BUN 28* 25*  CREATININE 1.59* 1.63*  CALCIUM 8.8* 7.7*  AST 18  --   ALT 9*  --   ALKPHOS 70  --   BILITOT 1.4*  --    ------------------------------------------------------------------------------------------------------------------  Cardiac Enzymes No results for input(s): TROPONINI in the last 168 hours. ------------------------------------------------------------------------------------------------------------------  RADIOLOGY:  Dg Chest 2 View  Result Date: 03/04/2017 CLINICAL DATA:  Vomiting for the past 2.5 weeks.  Ex-smoker. EXAM: CHEST  2 VIEW COMPARISON:  10/22/2014. FINDINGS: Normal sized heart with an interval decrease in size. Tortuous and partially calcified thoracic aorta. Stable mildly elevated right hemidiaphragm. Mild hyperexpansion of the lungs with mild central peribronchial thickening. Thoracic spine degenerative changes. Diffuse osteopenia. Stable left subclavian pacemaker leads. IMPRESSION: No acute abnormality.  Mild changes of COPD and chronic bronchitis. Electronically Signed   By: Claudie Revering M.D.   On: 03/04/2017 17:12   Ct Abdomen Pelvis W Contrast  Result Date: 03/04/2017 CLINICAL DATA:  2-1/2 week history of vomiting. EXAM: CT ABDOMEN AND PELVIS WITH CONTRAST TECHNIQUE: Multidetector CT imaging of the abdomen and pelvis was performed using the standard protocol following bolus administration of intravenous contrast. CONTRAST:  80mL ISOVUE-300 IOPAMIDOL (ISOVUE-300) INJECTION 61% COMPARISON:  01/30/2015 FINDINGS: Lower chest:  Emphysema with  basilar atelectasis or scarring. Hepatobiliary: Multiple hepatic cysts of varying  sizes again noted. A dominant cyst in the posterior right liver on the previous study shows interval involution. There is no evidence for gallstones, gallbladder wall thickening, or pericholecystic fluid. No intrahepatic or extrahepatic biliary dilation. Pancreas: No focal mass lesion. No dilatation of the main duct. No intraparenchymal cyst. No peripancreatic edema. Spleen: No splenomegaly. No focal mass lesion. Adrenals/Urinary Tract: No adrenal nodule or mass. Cortical atrophy noted in both kidneys. Subtle area of altered segmental perfusion in the lateral aspect of the interpolar right kidney (image 12 series 7). 2.8 cm exophytic lesion in the interpolar left kidney has attenuation too high to represent a simple cyst but is unchanged since 01/30/2015 suggesting benign etiology such as cyst complicated by proteinaceous debris or hemorrhage. 19 mm exophytic lesion posterior left interpolar kidney was 17 mm previously. Small exophytic lesions lower pole right kidney are stable. No hydroureteronephrosis. No evidence for hydroureter. Bladder is distended with stable appearance of a polypoid nodular lesion along the wall of the bladder base, at an apparent TURP defect. This nodular area is stable since the prior study. Stomach/Bowel: Small hiatal hernia. Stomach is nondistended. No gastric wall thickening. No evidence of outlet obstruction. Duodenum is normally positioned as is the ligament of Treitz. No small bowel wall thickening. No small bowel dilatation. The terminal ileum is normal. The appendix is normal. Diverticular changes are noted in the left colon without evidence of diverticulitis. Short segment proximal sigmoid colon extends into a left groin hernia without evidence for obstruction or incarceration by CT. Vascular/Lymphatic: Atherosclerotic calcification noted in the wall of the abdominal aorta which measures up to 3.3 cm  maximum infrarenal diameter. There is no gastrohepatic or hepatoduodenal ligament lymphadenopathy. No intraperitoneal or retroperitoneal lymphadenopathy. No pelvic sidewall lymphadenopathy. Reproductive: The prostate gland and seminal vesicles have normal imaging features. Other: No intraperitoneal free fluid. Musculoskeletal: As mentioned above, left groin hernia contains short segment proximal sigmoid colon without complicating features. Right groin hernia contains fat and a trace amount of fluid. Small sclerotic focus in the upper sacrum is stable. Bone windows reveal no worrisome lytic or sclerotic osseous lesions. Mild superior endplate compression deformity is evident at T12. IMPRESSION: 1. Focal area of altered perfusion in the interpolar right kidney suggests segmental edema. Pyelonephritis is suspected. 2. Atherosclerotic infrarenal fusiform abdominal aortic aneurysm measuring up to 3.3 cm. Recommend followup by ultrasound in 3 years. This recommendation follows ACR consensus guidelines: White Paper of the ACR Incidental Findings Committee II on Vascular Findings. J Am Coll Radiol 2013; 10:789-794. 3. Bilateral groin hernias. Left groin hernia contains short segment sigmoid colon without complicating features. Right groin hernia contains fat and a trace amount of free fluid. 4. Hepatic and renal cysts. Electronically Signed   By: Misty Stanley M.D.   On: 03/04/2017 18:32     ASSESSMENT AND PLAN:   83 year old male with history of hypertension who presents with weakness and found to have acute pyelonephritis  1. Acute pyelonephritis: Continue IV Rocephin and follow up on culture  2. Chronic kidney disease stage III: Creatinine is near baseline  3. BPH: Continue Flomax  4. Essential hypertension: Continue metoprolol  5. Anemia of chronic disease: Follow hemoglobin  Management plans discussed with the patient and he is in agreement.  CODE STATUS: dnr  TOTAL TIME TAKING CARE OF THIS  PATIENT: 30 minutes.     POSSIBLE D/C 1-2 days, DEPENDING ON CLINICAL CONDITION.   Jamilia Jacques M.D on 03/05/2017 at 8:41 AM  Between 7am to 6pm - Pager -  [phone] After 6pm go to [email or web address] - password [email or web address] [facility]  Office  [phone]  CC: Primary care physician; Kirk Ruths, MD  Note: This dictation was prepared with Dragon dictation along with smaller phrase technology. Any transcriptional errors that result from this process are unintentional.

## 2017-03-05 NOTE — Progress Notes (Signed)
Initial Nutrition Assessment  DOCUMENTATION CODES:   Severe malnutrition in context of chronic illness  INTERVENTION:  Recommend liberalizing diet to Regular. Also recommend chopped meats with gravy.  Provide Ensure Enlive po BID, each supplement provides 350 kcal and 20 grams of protein.   Encouraged patient to have a good protein source at each meal and snack. Discussed foods that have protein. Encourage him to continue drinking his Boost High Protein BID at home.   NUTRITION DIAGNOSIS:   Malnutrition (Severe) related to chronic illness (CKD III, advanced age, poor appetite) as evidenced by severe depletion of body fat, moderate depletions of muscle mass, severe depletion of muscle mass.  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    Spoke with patient and his family members at bedside. Patient is hard of hearing. He reports his appetite is usually pretty good, but food doesn't taste as good anymore. Family reports he does not eat very much. He typically skips breakfast, but if he does have breakfast he may have eggs or a gravy biscuit from a restaurant. In the afternoon/evening he likes to eat at [employer] and will get fried chicken with vegetables. If he does not eat at [employer] he will eat soup and salad. Reports he usually finishes his afternoon meal. Also snacks during the day on cookies and coffee. He drinks Boost High Protein BID at home. Also has an Activia protein shake he enjoys.  Patient reports he has been losing weight. Per chart he was 187 lbs in 2016. He has lost approximately 32 lbs (17.1% body wight) sometime over the past 2  years. Depending on how quickly weight loss occurred, may be significant for time frame, but unable to tell as patient cannot remember.  Meal Completion: bites/sips of breakfast this morning per chart  Medications reviewed and include: Miralax, ceftriaxone.  Labs reviewed: BUN 25, Creatinine 1.63, EGFR 34.   Nutrition-Focused physical exam completed. Findings are severe fat depletion, moderate-severe muscle depletion (moderate on legs, severe elsewhere), and no edema. Patient missing bottom molars. Reports he does not have any dentures.   Diet Order:  Diet Heart Room service appropriate? Yes; Fluid consistency: Thin  Skin:  Reviewed, no issues  Last BM:  03/05/2017 - type 5   Height:   Ht Readings from Last 1 Encounters:  03/04/17 _0  (1.88 m)    Weight:   Wt Readings from Last 1 Encounters:  03/04/17 155 lb 3.2 oz (70.4 kg)    Ideal Body Weight:  86.4 kg  BMI:  Body mass index is 19.93 kg/m.  Estimated Nutritional Needs:   Kcal:  1705-1990 (MSJ x 1.2-1.4)  Protein:  85-105 grams (1.2-1.5 grams/kg)  Fluid:  1.7 L/day (25 ml/kg)  EDUCATION NEEDS:   Education needs addressed  Willey Blade, MS, RD, LDN Pager: [phone] After Hours Pager: [phone]

## 2017-03-05 NOTE — Progress Notes (Signed)
Pt.'s POA is Shanon Payor the best number to contact him at is [phone].  Calia Napp [employer]

## 2017-03-05 NOTE — Progress Notes (Signed)
Chaplain responded to an OR for AD, [place] provided AD education to pt. Pt stated he was ready to complete AD, [place] contacted a notary public and 2 witnesses and had pt complete AD. A copy of AD was placed in pt's chart, the original and 2 extra copies were given pt.

## 2017-03-05 NOTE — Evaluation (Signed)
Physical Therapy Evaluation Patient Details Name: Marcus Cruz. MRN: [id number] DOB: Dec 11, 1920 Today's Date: 03/05/2017   History of Present Illness  Pt is a 83 y.o. male presenting to hospital with abdominal pain, constipation, chills, and vomiting.  Pt with recent UTI diagnosis and treatment.  Pt admitted to hospital with acute pyelonephritis and acute on chronic kidney disease stage III.  PMH includes B groin hernia's, htn, sarcoma removed L thigh 2011, and pacemaker.  Clinical Impression  Prior to hospital admission, pt was ambulating occasionally with SPC.  Pt lives with his wife on main floor of home with level entry.  Currently pt is SBA supine to sit, CGA to stand with RW, and CGA to ambulate around nursing loop with RW.  Pt with loss of balance standing from bed and then transferring to chair (without use of AD) requiring assist to steady but pt did well with use of RW during session (no loss of balance noted using RW with transfers and ambulation).  Pt would benefit from skilled PT to address noted impairments and functional limitations (see below for any additional details).  Upon hospital discharge, recommend pt discharge to home with HHPT and SBA with functional mobility for safety (with use of RW).    Follow Up Recommendations Home health PT;Supervision for mobility/OOB    Equipment Recommendations  Rolling walker with 5" wheels    Recommendations for Other Services       Precautions / Restrictions Precautions Precautions: Fall Restrictions Weight Bearing Restrictions: No      Mobility  Bed Mobility Overal bed mobility: Needs Assistance Bed Mobility: Supine to Sit     Supine to sit: Supervision;HOB elevated     General bed mobility comments: mild increased effort to perform  Transfers Overall transfer level: Needs assistance Equipment used: Rolling walker (2 wheeled) Transfers: Sit to/from [facility] Sit to Stand: Mod assist;Min  guard Stand pivot transfers: Min assist;Mod assist       General transfer comment: pt initially standing from bed without AD and fell backwards requiring mod assist to control descent sitting back on bed; CGA standing from bed and recliner with RW; min to mod assist for balance transferring to chair without AD d/t loss of balance backwards and to L; CGA transferring to chair with RW  Ambulation/Gait Ambulation/Gait assistance: Min guard Ambulation Distance (Feet): 200 Feet Assistive device: Rolling walker (2 wheeled) Gait Pattern/deviations: Step-through pattern     General Gait Details: steady with use of RW; mild decreased cadence  Stairs            Wheelchair Mobility    Modified Rankin (Stroke Patients Only)       Balance Overall balance assessment: Needs assistance Sitting-balance support: No upper extremity supported;Feet supported Sitting balance-Leahy Scale: Good Sitting balance - Comments: sitting reaching within BOS   Standing balance support: No upper extremity supported Standing balance-Leahy Scale: Poor Standing balance comment: static standing pt with posterior lean requiring assist for safety                             Pertinent Vitals/Pain Pain Assessment: No/denies pain  Vitals (HR and O2 on room air) stable and WFL throughout treatment session.    Home Living Family/patient expects to be discharged to:: Private residence Living Arrangements: Spouse/significant other Available Help at Discharge: Family Type of Home: House Home Access: Level entry     [place]: One level;Laundry or work area in  basement (Does not have to go to basement) Home Equipment: Kasandra Knudsen - single point      Prior Function Level of Independence: Independent with assistive device(s)         Comments: Pt reports occasionally using SPC for balance; denies any falls in past 6 months.     Hand Dominance        Extremity/Trunk Assessment   Upper  Extremity Assessment Upper Extremity Assessment: Generalized weakness    Lower Extremity Assessment Lower Extremity Assessment: Generalized weakness       Communication   Communication: HOH  Cognition Arousal/Alertness: Awake/alert Behavior During Therapy: WFL for tasks assessed/performed Overall Cognitive Status: Within Functional Limits for tasks assessed                                        General Comments General comments (skin integrity, edema, etc.): Pt resting in bed upon PT arrival.  Nursing cleared pt for participation in physical therapy.  Pt agreeable to PT session.    Exercises  Gait training with RW; cueing for correct walker use and positioning with walker use   Assessment/Plan    PT Assessment Patient needs continued PT services  PT Problem List Decreased strength;Decreased balance;Decreased mobility;Decreased knowledge of use of DME       PT Treatment Interventions DME instruction;Gait training;Functional mobility training;Therapeutic activities;Therapeutic exercise;Balance training;Patient/family education    PT Goals (Current goals can be found in the Care Plan section)  Acute Rehab PT Goals Patient Stated Goal: to go home PT Goal Formulation: With patient Time For Goal Achievement: 03/19/17 Potential to Achieve Goals: Good    Frequency Min 2X/week   Barriers to discharge        Co-evaluation               AM-PAC PT "6 Clicks" Daily Activity  Outcome Measure Difficulty turning over in bed (including adjusting bedclothes, sheets and blankets)?: A Little Difficulty moving from lying on back to sitting on the side of the bed? : A Little Difficulty sitting down on and standing up from a chair with arms (e.g., wheelchair, bedside commode, etc,.)?: Total Help needed moving to and from a bed to chair (including a wheelchair)?: A Lot Help needed walking in hospital room?: A Little Help needed climbing 3-5 steps with a railing? :  A Little 6 Click Score: 15    End of Session Equipment Utilized During Treatment: Gait belt Activity Tolerance: Patient tolerated treatment well Patient left: in chair;with call bell/phone within reach;with chair alarm set Nurse Communication: Mobility status;Precautions (Pt's balance) PT Visit Diagnosis: Unsteadiness on feet (R26.81);Muscle weakness (generalized) (M62.81);Other abnormalities of gait and mobility (R26.89)    Time: 5643-3295 PT Time Calculation (min) (ACUTE ONLY): 23 min   Charges:   PT Evaluation $PT Eval Low Complexity: 1 Procedure PT Treatments $Gait Training: 8-22 mins   PT G CodesLeitha Bleak, PT 03/05/17, 4:56 PM [phone]

## 2017-03-06 LAB — HEPATITIS C ANTIBODY: HCV Ab: <0.1 s/co ratio (ref 0.0–0.9)

## 2017-03-06 NOTE — Progress Notes (Signed)
Patient  voided 300cc dark amber cloudy urine this shift, only consumed 240 of fluid. Denies pain or distress.

## 2017-03-06 NOTE — Progress Notes (Signed)
[place] at [place] NAME: Marcus Cruz    MR#:  [id number]  DATE OF BIRTH:  1920/10/23  SUBJECTIVE:   Doing well this am  REVIEW OF SYSTEMS:    Review of Systems  Constitutional: Negative for fever, chills weight loss HENT: Negative for ear pain, nosebleeds, congestion, facial swelling, rhinorrhea, neck pain, neck stiffness and ear discharge.   Respiratory: Negative for cough, shortness of breath, wheezing  Cardiovascular: Negative for chest pain, palpitations and leg swelling.  Gastrointestinal: Negative for heartburn, abdominal pain, vomiting, diarrhea or consitpation Genitourinary: Negative for dysuria, urgency, frequency, hematuria Musculoskeletal: Negative for back pain or joint pain Neurological: Negative for dizziness, seizures, syncope, focal weakness,  numbness and headaches.  Hematological: Does not bruise/bleed easily.  Psychiatric/Behavioral: Negative for hallucinations, confusion, dysphoric mood    Tolerating Diet: yes      DRUG ALLERGIES:  No Known Allergies  VITALS:  Blood pressure (!) 127/43, pulse 66, temperature 97.6 F (36.4 C), temperature source Oral, resp. rate 20, height 6\' 2"  (1.88 m), weight 70.4 kg (155 lb 3.2 oz), SpO2 95 %.  PHYSICAL EXAMINATION:  Constitutional: Appears well-developed and well-nourished. No distress. HENT: Normocephalic. Marland Kitchen Oropharynx is clear and moist.  Eyes: Conjunctivae and EOM are normal. PERRLA, no scleral icterus.  Neck: Normal ROM. Neck supple. No JVD. No tracheal deviation. CVS: RRR, S1/S2 +, no murmurs, no gallops, no carotid bruit.  Pulmonary: Effort and breath sounds normal, no stridor, rhonchi, wheezes, rales.  Abdominal: Soft. BS +,  no distension, tenderness, rebound or guarding.  Musculoskeletal: Normal range of motion. No edema and no tenderness.  Neuro: Alert. CN 2-12 grossly intact. No focal deficits. Skin: Skin is warm and dry. No rash noted. Psychiatric:  Normal mood and affect.      LABORATORY PANEL:   CBC  Recent Labs Lab 03/05/17 0411  WBC 6.6  HGB 10.7*  HCT 30.8*  PLT 92*   ------------------------------------------------------------------------------------------------------------------  Chemistries   Recent Labs Lab 03/04/17 1432 03/05/17 0411  NA 137 138  K 4.1 4.2  CL 104 107  CO2 24 24  GLUCOSE 129* 113*  BUN 28* 25*  CREATININE 1.59* 1.63*  CALCIUM 8.8* 7.7*  AST 18  --   ALT 9*  --   ALKPHOS 70  --   BILITOT 1.4*  --    ------------------------------------------------------------------------------------------------------------------  Cardiac Enzymes No results for input(s): TROPONINI in the last 168 hours. ------------------------------------------------------------------------------------------------------------------  RADIOLOGY:  Dg Chest 2 View  Result Date: 03/04/2017 CLINICAL DATA:  Vomiting for the past 2.5 weeks.  Ex-smoker. EXAM: CHEST  2 VIEW COMPARISON:  10/22/2014. FINDINGS: Normal sized heart with an interval decrease in size. Tortuous and partially calcified thoracic aorta. Stable mildly elevated right hemidiaphragm. Mild hyperexpansion of the lungs with mild central peribronchial thickening. Thoracic spine degenerative changes. Diffuse osteopenia. Stable left subclavian pacemaker leads. IMPRESSION: No acute abnormality.  Mild changes of COPD and chronic bronchitis. Electronically Signed   By: Claudie Revering M.D.   On: 03/04/2017 17:12   Ct Abdomen Pelvis W Contrast  Result Date: 03/04/2017 CLINICAL DATA:  2-1/2 week history of vomiting. EXAM: CT ABDOMEN AND PELVIS WITH CONTRAST TECHNIQUE: Multidetector CT imaging of the abdomen and pelvis was performed using the standard protocol following bolus administration of intravenous contrast. CONTRAST:  77mL ISOVUE-300 IOPAMIDOL (ISOVUE-300) INJECTION 61% COMPARISON:  01/30/2015 FINDINGS: Lower chest:  Emphysema with basilar atelectasis or scarring.  Hepatobiliary: Multiple hepatic cysts of varying sizes again noted. A dominant cyst in  the posterior right liver on the previous study shows interval involution. There is no evidence for gallstones, gallbladder wall thickening, or pericholecystic fluid. No intrahepatic or extrahepatic biliary dilation. Pancreas: No focal mass lesion. No dilatation of the main duct. No intraparenchymal cyst. No peripancreatic edema. Spleen: No splenomegaly. No focal mass lesion. Adrenals/Urinary Tract: No adrenal nodule or mass. Cortical atrophy noted in both kidneys. Subtle area of altered segmental perfusion in the lateral aspect of the interpolar right kidney (image 12 series 7). 2.8 cm exophytic lesion in the interpolar left kidney has attenuation too high to represent a simple cyst but is unchanged since 01/30/2015 suggesting benign etiology such as cyst complicated by proteinaceous debris or hemorrhage. 19 mm exophytic lesion posterior left interpolar kidney was 17 mm previously. Small exophytic lesions lower pole right kidney are stable. No hydroureteronephrosis. No evidence for hydroureter. Bladder is distended with stable appearance of a polypoid nodular lesion along the wall of the bladder base, at an apparent TURP defect. This nodular area is stable since the prior study. Stomach/Bowel: Small hiatal hernia. Stomach is nondistended. No gastric wall thickening. No evidence of outlet obstruction. Duodenum is normally positioned as is the ligament of Treitz. No small bowel wall thickening. No small bowel dilatation. The terminal ileum is normal. The appendix is normal. Diverticular changes are noted in the left colon without evidence of diverticulitis. Short segment proximal sigmoid colon extends into a left groin hernia without evidence for obstruction or incarceration by CT. Vascular/Lymphatic: Atherosclerotic calcification noted in the wall of the abdominal aorta which measures up to 3.3 cm maximum infrarenal diameter.  There is no gastrohepatic or hepatoduodenal ligament lymphadenopathy. No intraperitoneal or retroperitoneal lymphadenopathy. No pelvic sidewall lymphadenopathy. Reproductive: The prostate gland and seminal vesicles have normal imaging features. Other: No intraperitoneal free fluid. Musculoskeletal: As mentioned above, left groin hernia contains short segment proximal sigmoid colon without complicating features. Right groin hernia contains fat and a trace amount of fluid. Small sclerotic focus in the upper sacrum is stable. Bone windows reveal no worrisome lytic or sclerotic osseous lesions. Mild superior endplate compression deformity is evident at T12. IMPRESSION: 1. Focal area of altered perfusion in the interpolar right kidney suggests segmental edema. Pyelonephritis is suspected. 2. Atherosclerotic infrarenal fusiform abdominal aortic aneurysm measuring up to 3.3 cm. Recommend followup by ultrasound in 3 years. This recommendation follows ACR consensus guidelines: White Paper of the ACR Incidental Findings Committee II on Vascular Findings. J Am Coll Radiol 2013; 10:789-794. 3. Bilateral groin hernias. Left groin hernia contains short segment sigmoid colon without complicating features. Right groin hernia contains fat and a trace amount of free fluid. 4. Hepatic and renal cysts. Electronically Signed   By: Misty Stanley M.D.   On: 03/04/2017 18:32     ASSESSMENT AND PLAN:   83 year old male with history of hypertension who presents with weakness and found to have acute pyelonephritis  1. E coli Acute pyelonephritis with bacteremia: Continue IV Rocephin and follow up on FINAL culture and senstivities  2. Chronic kidney disease stage III: Creatinine is near baseline  3. BPH: Continue Flomax  4. Essential hypertension: Continue metoprolol  5. Anemia of chronic disease: Follow hemoglobin  Management plans discussed with the patient and family he is in agreement.  CODE STATUS: dnr  TOTAL TIME  TAKING CARE OF THIS PATIENT: 24 minutes.     POSSIBLE D/C tomorrow, DEPENDING ON CLINICAL CONDITION.   Emmersyn Kratzke M.D on 03/06/2017 at 9:50 AM  Between 7am to 6pm - Pager -  [phone] After 6pm go to [email or web address] - password [email or web address] [facility]  Office  [phone]  CC: Primary care physician; Kirk Ruths, MD  Note: This dictation was prepared with Dragon dictation along with smaller phrase technology. Any transcriptional errors that result from this process are unintentional.

## 2017-03-07 LAB — URINE CULTURE

## 2017-03-07 LAB — CULTURE, BLOOD (ROUTINE X 2): SPECIAL REQUESTS: ADEQUATE

## 2017-03-07 MED ORDER — ENSURE ENLIVE PO LIQD: 237.0000 mL | Freq: Two times a day (BID) | ORAL | 12 refills | Status: DC

## 2017-03-07 MED ORDER — CEPHALEXIN 250 MG PO CAPS
250.0000 mg | ORAL_CAPSULE | Freq: Two times a day (BID) | ORAL | Status: DC
  Filled 2017-03-07: qty 1

## 2017-03-07 MED ORDER — PROBIOTIC ACIDOPHILUS PO TABS
1.0000 | ORAL_TABLET | Freq: Every day | ORAL | 0 refills | Status: AC
Start: 2017-03-07 — End: 2017-06-07

## 2017-03-07 MED ORDER — CEPHALEXIN 500 MG PO CAPS: 500.0000 mg | ORAL_CAPSULE | Freq: Three times a day (TID) | ORAL | 0 refills | Status: AC

## 2017-03-07 MED ORDER — TAMSULOSIN HCL 0.4 MG PO CAPS: 0.4000 mg | ORAL_CAPSULE | Freq: Every day | ORAL | 0 refills | Status: DC

## 2017-03-07 MED ORDER — DEXTROSE 5 % IV SOLN
2.0000 g | Freq: Once | INTRAVENOUS | Status: AC
  Administered 2017-03-07: 2 g via INTRAVENOUS
  Filled 2017-03-07: qty 2

## 2017-03-07 NOTE — Progress Notes (Signed)
Renal dose adjustment:  Order for cephalexin 500 mg PO q8h was changed to 250 mg PO q12h per protocol for CrCl 27 mL/min.  Lenis Noon, PharmD 03/07/17 9:10 AM

## 2017-03-07 NOTE — Care Management Note (Signed)
Case Management Note  Patient Details  Name: Marcus Cruz. MRN: [id number] Date of Birth: Jan 19, 1921  Subjective/Objective:    Discussed discharge planning with Marcus Cruz who reports that he has a RW at home. Marcus Cruz declined an offer of home health PT stating "I don't need that. I will be fine once I get home."  No other discharge needs identified.                 Action/Plan:   Expected Discharge Date:  03/07/17               Expected Discharge Plan:   03/07/17  In-House Referral:     Discharge planning Services     Post Acute Care Choice:   Yes Choice offered to:   Patient  DME Arranged:   NA DME Agency:     HH Arranged:   PT  [place] Agency:   Patient refused  Status of Service:   Completed  If discussed at [place] of Stay Meetings, dates discussed:    Additional Comments:  Tavone Caesar A, RN 03/07/2017, 9:54 AM

## 2017-03-07 NOTE — Progress Notes (Signed)
Patient discharge teaching given, including activity, diet, follow-up appoints, and medications. Patient verbalized understanding of all discharge instructions. IV access was d/c'd. Vitals are stable. Skin is intact except as charted in most recent assessments. Pt to be escorted out by NT, to be driven home by family.  Marcus Cruz  

## 2017-03-07 NOTE — Discharge Summary (Signed)
[place] at [place] NAME: Marcus Cruz    MR#:  [id number]  DATE OF BIRTH:  11-29-1920  DATE OF ADMISSION:  03/04/2017 ADMITTING PHYSICIAN: Loletha Grayer, MD  DATE OF DISCHARGE: 03/07/2017  PRIMARY CARE PHYSICIAN: Kirk Ruths, MD    ADMISSION DIAGNOSIS:  Pyelonephritis [N12]  DISCHARGE DIAGNOSIS:  Active Problems:   Pyelonephritis   SECONDARY DIAGNOSIS:   Past Medical History:  Diagnosis Date  . Cancer [facility]) 2010   sarcoma resected from Left thigh.  . Hypertension     HOSPITAL COURSE:   83 year old male with history of hypertension who presents with weakness and found to have acute pyelonephritis  1. E coli Acute pyelonephritis with E coli bacteremia: This is Pansensitive he will be discharged with Keflex for 11 more days to make a total of 2 weeks.  2. Chronic kidney disease stage III: Creatinine is at  baseline  3. BPH: Continue Flomax  4. Essential hypertension: Continue metoprolol  5. Anemia of chronic disease: Follow hemoglobin   DISCHARGE CONDITIONS AND DIET:   Stable for discharge on regular diet  CONSULTS OBTAINED:    DRUG ALLERGIES:  No Known Allergies  DISCHARGE MEDICATIONS:   Current Discharge Medication List    START taking these medications   Details  cephALEXin (KEFLEX) 500 MG capsule Take 1 capsule (500 mg total) by mouth 3 (three) times daily. Qty: 33 capsule, Refills: 0    feeding supplement, ENSURE ENLIVE, (ENSURE ENLIVE) LIQD Take 237 mLs by mouth 2 (two) times daily between meals. Qty: 237 mL, Refills: 12    Lactobacillus (PROBIOTIC ACIDOPHILUS) TABS Take 1 tablet by mouth daily. Qty: 90 tablet, Refills: 0    tamsulosin (FLOMAX) 0.4 MG CAPS capsule Take 1 capsule (0.4 mg total) by mouth daily. Qty: 30 capsule, Refills: 0      CONTINUE these medications which have NOT CHANGED   Details  aspirin EC 81 MG tablet Take 81 mg by mouth daily.    furosemide (LASIX) 20 MG  tablet Take 20 mg by mouth daily.    metoprolol succinate (TOPROL-XL) 25 MG 24 hr tablet Take 25 mg by mouth daily.          Today   CHIEF COMPLAINT:  No acute events overnight.   VITAL SIGNS:  Blood pressure (!) 128/48, pulse 73, temperature 98.4 F (36.9 C), temperature source Oral, resp. rate 20, height 6\' 2"  (1.88 m), weight 70.4 kg (155 lb 3.2 oz), SpO2 95 %.   REVIEW OF SYSTEMS:  Review of Systems  Constitutional: Negative.  Negative for chills, fever and malaise/fatigue.  HENT: Negative.  Negative for ear discharge, ear pain, hearing loss, nosebleeds and sore throat.   Eyes: Negative.  Negative for blurred vision and pain.  Respiratory: Negative.  Negative for cough, hemoptysis, shortness of breath and wheezing.   Cardiovascular: Negative.  Negative for chest pain, palpitations and leg swelling.  Gastrointestinal: Negative.  Negative for abdominal pain, blood in stool, diarrhea, nausea and vomiting.  Genitourinary: Negative.  Negative for dysuria.  Musculoskeletal: Negative.  Negative for back pain.  Skin: Negative.   Neurological: Negative for dizziness, tremors, speech change, focal weakness, seizures and headaches.  Endo/Heme/Allergies: Negative.  Does not bruise/bleed easily.  Psychiatric/Behavioral: Negative.  Negative for depression, hallucinations and suicidal ideas.     PHYSICAL EXAMINATION:  GENERAL:  83 y.o.-year-old patient lying in the bed with no acute distress.  NECK:  Supple, no jugular venous distention. No thyroid enlargement, no tenderness.  LUNGS: Normal breath sounds bilaterally, no wheezing, rales,rhonchi  No use of accessory muscles of respiration.  CARDIOVASCULAR: S1, S2 normal. No murmurs, rubs, or gallops.  ABDOMEN: Soft, non-tender, non-distended. Bowel sounds present. No organomegaly or mass.  EXTREMITIES: No pedal edema, cyanosis, or clubbing.  PSYCHIATRIC: The patient is alert and oriented x 3.  SKIN: No obvious rash, lesion, or ulcer.    DATA REVIEW:   CBC  Recent Labs Lab 03/05/17 0411  WBC 6.6  HGB 10.7*  HCT 30.8*  PLT 92*    Chemistries   Recent Labs Lab 03/04/17 1432 03/05/17 0411  NA 137 138  K 4.1 4.2  CL 104 107  CO2 24 24  GLUCOSE 129* 113*  BUN 28* 25*  CREATININE 1.59* 1.63*  CALCIUM 8.8* 7.7*  AST 18  --   ALT 9*  --   ALKPHOS 70  --   BILITOT 1.4*  --     Cardiac Enzymes No results for input(s): TROPONINI in the last 168 hours.  Microbiology Results  @[email or web address] @  RADIOLOGY:  No results found.    Current Discharge Medication List    START taking these medications   Details  cephALEXin (KEFLEX) 500 MG capsule Take 1 capsule (500 mg total) by mouth 3 (three) times daily. Qty: 33 capsule, Refills: 0    feeding supplement, ENSURE ENLIVE, (ENSURE ENLIVE) LIQD Take 237 mLs by mouth 2 (two) times daily between meals. Qty: 237 mL, Refills: 12    Lactobacillus (PROBIOTIC ACIDOPHILUS) TABS Take 1 tablet by mouth daily. Qty: 90 tablet, Refills: 0    tamsulosin (FLOMAX) 0.4 MG CAPS capsule Take 1 capsule (0.4 mg total) by mouth daily. Qty: 30 capsule, Refills: 0      CONTINUE these medications which have NOT CHANGED   Details  aspirin EC 81 MG tablet Take 81 mg by mouth daily.    furosemide (LASIX) 20 MG tablet Take 20 mg by mouth daily.    metoprolol succinate (TOPROL-XL) 25 MG 24 hr tablet Take 25 mg by mouth daily.          Management plans discussed with the patient and he is in agreement. Stable for discharge home  Patient should follow up with pcp  CODE STATUS:     Code Status Orders        Start     Ordered   03/05/17 0846  Do not attempt resuscitation (DNR)  Continuous    Question Answer Comment  In the event of cardiac or respiratory ARREST Do not call a "code blue"   In the event of cardiac or respiratory ARREST Do not perform Intubation, CPR, defibrillation or ACLS   In the event of cardiac or respiratory ARREST Use medication by any route,  position, wound care, and other measures to relive pain and suffering. May use oxygen, suction and manual treatment of airway obstruction as needed for comfort.      03/05/17 0845    Code Status History    Date Active Date Inactive Code Status Order ID Comments User Context   03/04/2017  8:30 PM 03/05/2017  8:45 AM DNR [id number]  Loletha Grayer, MD ED    Advance Directive Documentation     Most Recent Value  Type of Advance Directive  Healthcare Power of Attorney, Living will  Pre-existing out of facility DNR order (yellow form or pink MOST form)  -  "MOST" Form in Place?  -      TOTAL TIME TAKING CARE OF THIS PATIENT: 10  minutes.    Note: This dictation was prepared with Dragon dictation along with smaller phrase technology. Any transcriptional errors that result from this process are unintentional.  Ezri Fanguy M.D on 03/07/2017 at 9:19 AM  Between 7am to 6pm - Pager - [phone] After 6pm go to [email or web address] - password [email or web address] [place] [facility]  Office  [phone]  CC: Primary care physician; Kirk Ruths, MD

## 2017-03-09 LAB — CULTURE, BLOOD (ROUTINE X 2)
Culture: NO GROWTH
Special Requests: ADEQUATE

## 2018-10-04 ENCOUNTER — Emergency Department: Payer: Medicare Other

## 2018-10-04 ENCOUNTER — Inpatient Hospital Stay: Payer: Medicare Other

## 2018-10-04 ENCOUNTER — Other Ambulatory Visit: Payer: Self-pay

## 2018-10-04 ENCOUNTER — Encounter: Payer: Self-pay | Admitting: Emergency Medicine

## 2018-10-04 ENCOUNTER — Inpatient Hospital Stay: Admit: 2018-10-04 | Payer: Medicare Other

## 2018-10-04 ENCOUNTER — Inpatient Hospital Stay
Admission: EM | Admit: 2018-10-04 | Discharge: 2018-10-06 | DRG: 065 | Disposition: A | Discharge status: Hospice | Payer: Medicare Other | Attending: Internal Medicine | Admitting: Internal Medicine

## 2018-10-04 DIAGNOSIS — Z515 Encounter for palliative care: Secondary | ICD-10-CM | POA: Diagnosis present

## 2018-10-04 DIAGNOSIS — R918 Other nonspecific abnormal finding of lung field: Secondary | ICD-10-CM | POA: Diagnosis present

## 2018-10-04 DIAGNOSIS — Z8249 Family history of ischemic heart disease and other diseases of the circulatory system: Secondary | ICD-10-CM | POA: Diagnosis not present

## 2018-10-04 DIAGNOSIS — I130 Hypertensive heart and chronic kidney disease with heart failure and stage 1 through stage 4 chronic kidney disease, or unspecified chronic kidney disease: Secondary | ICD-10-CM | POA: Diagnosis present

## 2018-10-04 DIAGNOSIS — N183 Chronic kidney disease, stage 3 (moderate): Secondary | ICD-10-CM | POA: Diagnosis present

## 2018-10-04 DIAGNOSIS — I959 Hypotension, unspecified: Secondary | ICD-10-CM | POA: Diagnosis present

## 2018-10-04 DIAGNOSIS — Z66 Do not resuscitate: Secondary | ICD-10-CM | POA: Diagnosis present

## 2018-10-04 DIAGNOSIS — N3000 Acute cystitis without hematuria: Secondary | ICD-10-CM | POA: Diagnosis present

## 2018-10-04 DIAGNOSIS — Z7189 Other specified counseling: Secondary | ICD-10-CM

## 2018-10-04 DIAGNOSIS — Z950 Presence of cardiac pacemaker: Secondary | ICD-10-CM | POA: Diagnosis not present

## 2018-10-04 DIAGNOSIS — F0390 Unspecified dementia without behavioral disturbance: Secondary | ICD-10-CM | POA: Diagnosis present

## 2018-10-04 DIAGNOSIS — R778 Other specified abnormalities of plasma proteins: Secondary | ICD-10-CM

## 2018-10-04 DIAGNOSIS — I6782 Cerebral ischemia: Secondary | ICD-10-CM | POA: Diagnosis present

## 2018-10-04 DIAGNOSIS — Z87891 Personal history of nicotine dependence: Secondary | ICD-10-CM | POA: Diagnosis not present

## 2018-10-04 DIAGNOSIS — C7652 Malignant neoplasm of left lower limb: Secondary | ICD-10-CM | POA: Diagnosis present

## 2018-10-04 DIAGNOSIS — I639 Cerebral infarction, unspecified: Secondary | ICD-10-CM | POA: Diagnosis present

## 2018-10-04 DIAGNOSIS — Z79899 Other long term (current) drug therapy: Secondary | ICD-10-CM

## 2018-10-04 DIAGNOSIS — I5022 Chronic systolic (congestive) heart failure: Secondary | ICD-10-CM | POA: Diagnosis present

## 2018-10-04 DIAGNOSIS — I248 Other forms of acute ischemic heart disease: Secondary | ICD-10-CM | POA: Diagnosis present

## 2018-10-04 DIAGNOSIS — G459 Transient cerebral ischemic attack, unspecified: Secondary | ICD-10-CM

## 2018-10-04 DIAGNOSIS — E860 Dehydration: Secondary | ICD-10-CM | POA: Diagnosis present

## 2018-10-04 DIAGNOSIS — Z7982 Long term (current) use of aspirin: Secondary | ICD-10-CM | POA: Diagnosis not present

## 2018-10-04 DIAGNOSIS — N179 Acute kidney failure, unspecified: Secondary | ICD-10-CM | POA: Diagnosis present

## 2018-10-04 DIAGNOSIS — R4701 Aphasia: Secondary | ICD-10-CM | POA: Diagnosis present

## 2018-10-04 DIAGNOSIS — R7989 Other specified abnormal findings of blood chemistry: Secondary | ICD-10-CM

## 2018-10-04 DIAGNOSIS — N390 Urinary tract infection, site not specified: Secondary | ICD-10-CM | POA: Diagnosis present

## 2018-10-04 DIAGNOSIS — J019 Acute sinusitis, unspecified: Secondary | ICD-10-CM | POA: Diagnosis present

## 2018-10-04 LAB — TROPONIN I
Troponin I: 0.36 ng/mL (ref <0.03)
Troponin I: 0.34 ng/mL (ref <0.03)
Troponin I: 0.37 ng/mL (ref <0.03)

## 2018-10-04 LAB — BASIC METABOLIC PANEL
Anion gap: 6 (ref 5–15)
BUN: 39 mg/dL — ABNORMAL HIGH (ref 8–23)
CO2: 23 mmol/L (ref 22–32)
Calcium: 8.6 mg/dL — ABNORMAL LOW (ref 8.9–10.3)
Chloride: 109 mmol/L (ref 98–111)
Creatinine, Ser: 1.75 mg/dL — ABNORMAL HIGH (ref 0.61–1.24)
GFR calc Af Amer: 38 mL/min — ABNORMAL LOW (ref >60)
GFR calc non Af Amer: 32 mL/min — ABNORMAL LOW (ref >60)
Glucose, Bld: 95 mg/dL (ref 70–99)
Potassium: 4.8 mmol/L (ref 3.5–5.1)
Sodium: 138 mmol/L (ref 135–145)

## 2018-10-04 LAB — PROTIME-INR
INR: 1.02
Prothrombin Time: 13.3 seconds (ref 11.4–15.2)

## 2018-10-04 LAB — URINALYSIS, COMPLETE (UACMP) WITH MICROSCOPIC
Bilirubin Urine: NEGATIVE
Glucose, UA: NEGATIVE mg/dL
Ketones, ur: NEGATIVE mg/dL
Nitrite: NEGATIVE
PROTEIN: 30 mg/dL — AB
RBC / HPF: >50 RBC/hpf — ABNORMAL HIGH (ref 0–5)
Specific Gravity, Urine: 1.011 (ref 1.005–1.030)
WBC, UA: >50 WBC/hpf — ABNORMAL HIGH (ref 0–5)
pH: 5 (ref 5.0–8.0)

## 2018-10-04 LAB — HEPATIC FUNCTION PANEL
ALK PHOS: 107 U/L (ref 38–126)
ALT: 47 U/L — AB (ref 0–44)
AST: 95 U/L — AB (ref 15–41)
Albumin: 2.9 g/dL — ABNORMAL LOW (ref 3.5–5.0)
Bilirubin, Direct: 0.2 mg/dL (ref 0.0–0.2)
Indirect Bilirubin: 0.8 mg/dL (ref 0.3–0.9)
Total Bilirubin: 1 mg/dL (ref 0.3–1.2)
Total Protein: 6.2 g/dL — ABNORMAL LOW (ref 6.5–8.1)

## 2018-10-04 LAB — CBC WITH DIFFERENTIAL/PLATELET
ABS IMMATURE GRANULOCYTES: 0.06 10*3/uL (ref 0.00–0.07)
BASOS PCT: 0 %
Basophils Absolute: 0 10*3/uL (ref 0.0–0.1)
Eosinophils Absolute: 0.1 10*3/uL (ref 0.0–0.5)
Eosinophils Relative: 1 %
HCT: 32.1 % — ABNORMAL LOW (ref 39.0–52.0)
Hemoglobin: 10.2 g/dL — ABNORMAL LOW (ref 13.0–17.0)
Immature Granulocytes: 1 %
Lymphocytes Relative: 26 %
Lymphs Abs: 2.9 10*3/uL (ref 0.7–4.0)
MCH: 30.7 pg (ref 26.0–34.0)
MCHC: 31.8 g/dL (ref 30.0–36.0)
MCV: 96.7 fL (ref 80.0–100.0)
MONOS PCT: 16 %
Monocytes Absolute: 1.8 10*3/uL — ABNORMAL HIGH (ref 0.1–1.0)
Neutro Abs: 6.2 10*3/uL (ref 1.7–7.7)
Neutrophils Relative %: 56 %
Platelets: 111 10*3/uL — ABNORMAL LOW (ref 150–400)
RBC: 3.32 MIL/uL — ABNORMAL LOW (ref 4.22–5.81)
RDW: 14.4 % (ref 11.5–15.5)
WBC: 11.1 10*3/uL — ABNORMAL HIGH (ref 4.0–10.5)
nRBC: 0 % (ref 0.0–0.2)

## 2018-10-04 LAB — LACTIC ACID, PLASMA: Lactic Acid, Venous: 1.8 mmol/L (ref 0.5–1.9)

## 2018-10-04 MED ORDER — SODIUM CHLORIDE 0.9 % IV BOLUS
500.0000 mL | Freq: Once | INTRAVENOUS | Status: AC
  Administered 2018-10-04: 500 mL via INTRAVENOUS

## 2018-10-04 MED ORDER — ACETAMINOPHEN 325 MG PO TABS: 650.0000 mg | ORAL_TABLET | ORAL | Status: DC | PRN

## 2018-10-04 MED ORDER — INFLUENZA VAC SPLIT HIGH-DOSE 0.5 ML IM SUSY
0.5000 mL | PREFILLED_SYRINGE | INTRAMUSCULAR | Status: DC
  Filled 2018-10-04: qty 0.5

## 2018-10-04 MED ORDER — STROKE: EARLY STAGES OF RECOVERY BOOK
Freq: Once | Status: AC
  Administered 2018-10-04: 12:00:00

## 2018-10-04 MED ORDER — ACETAMINOPHEN 650 MG RE SUPP: 650.0000 mg | RECTAL | Status: DC | PRN

## 2018-10-04 MED ORDER — SODIUM CHLORIDE 0.9 % IV SOLN
INTRAVENOUS | Status: AC
  Administered 2018-10-04: 18:00:00 via INTRAVENOUS

## 2018-10-04 MED ORDER — SODIUM CHLORIDE 0.9 % IV SOLN: INTRAVENOUS | Status: DC

## 2018-10-04 MED ORDER — SODIUM CHLORIDE 0.9 % IV SOLN
1.0000 g | Freq: Once | INTRAVENOUS | Status: AC
  Administered 2018-10-04: 1 g via INTRAVENOUS
  Filled 2018-10-04: qty 10

## 2018-10-04 MED ORDER — SODIUM CHLORIDE 0.9 % IV SOLN
1.0000 g | INTRAVENOUS | Status: DC
  Administered 2018-10-05: 1 g via INTRAVENOUS
  Filled 2018-10-04: qty 1
  Filled 2018-10-04: qty 10

## 2018-10-04 MED ORDER — ENOXAPARIN SODIUM 40 MG/0.4ML ~~LOC~~ SOLN
30.0000 mg | SUBCUTANEOUS | Status: DC
  Administered 2018-10-04: 17:00:00 30 mg via SUBCUTANEOUS
  Filled 2018-10-04: qty 0.4

## 2018-10-04 MED ORDER — MORPHINE SULFATE (PF) 2 MG/ML IV SOLN
1.0000 mg | Freq: Three times a day (TID) | INTRAVENOUS | Status: DC | PRN
  Administered 2018-10-04 – 2018-10-05 (×2): 1 mg via INTRAVENOUS
  Filled 2018-10-04 (×3): qty 1

## 2018-10-04 MED ORDER — ACETAMINOPHEN 160 MG/5ML PO SOLN
650.0000 mg | ORAL | Status: DC | PRN
  Filled 2018-10-04: qty 20.3

## 2018-10-04 MED ORDER — SODIUM CHLORIDE 0.9 % IV SOLN
INTRAVENOUS | Status: DC | PRN
  Administered 2018-10-04: 1000 mL via INTRAVENOUS

## 2018-10-04 MED ORDER — ASPIRIN 300 MG RE SUPP
300.0000 mg | Freq: Once | RECTAL | Status: AC
  Administered 2018-10-04: 300 mg via RECTAL
  Filled 2018-10-04: qty 1

## 2018-10-04 NOTE — H&P (Signed)
[facility] NAME: Marcus Cruz    MR#:  [id number]  DATE OF BIRTH:  Dec 12, 1920  DATE OF ADMISSION:  10/04/2018  PRIMARY CARE PHYSICIAN: Kirk Ruths, MD   REQUESTING/REFERRING PHYSICIAN: Arta Silence, MD  CHIEF COMPLAINT:   aphasia HISTORY OF PRESENT ILLNESS:  Marcus Cruz  is a 85 y.o. 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  His spouse states that when she last saw him last night he was at his baseline without any difficulty with the speech.  Today morning he was unable to get out of the toilet seat feeling very weak and unable to speak.  Initial CT of the head with no acute findings.  Patient has pacemaker could not get MRI of the brain.  Urinalysis looks abnormal.  Renal function is worse than his baseline.  White blood cell count is elevated but lactic acid is normal at 1.8.  Patient was received IV Rocephin after urine culture and sensitivity.  Hospitalist team is called to admit the patient.  During my examination patient is able to answer his questions by nodding his head but unable to speak.  According to the ED nurse patient is gurgling could not pass bedside swallow evaluation.  Chest x-ray results discussed with the patient and his family members and they refused any further investigations at this time  PAST MEDICAL HISTORY:   Past Medical History:  Diagnosis Date  . Cancer [facility]) 2010   sarcoma resected from Left thigh.  . Hypertension     PAST SURGICAL HISTOIRY:   Past Surgical History:  Procedure Laterality Date  . INSERT / REPLACE / REMOVE PACEMAKER    . PACEMAKER INSERTION    . sarcoma surgery Left 2011    SOCIAL HISTORY:   Social History   Tobacco Use  .  Smoking status: Former Smoker    Last attempt to quit: 2008    Years since quitting: 12.0  . Smokeless tobacco: Never Used  Substance Use Topics  . Alcohol use: Yes    Comment: rarely    FAMILY HISTORY:   Family History  Problem Relation Age of Onset  . CAD Father     DRUG ALLERGIES:  No Known Allergies  REVIEW OF SYSTEMS:  Review of system limited as patient is with aphasia  CONSTITUTIONAL: No fever,  weakness.  EYES: No blurred vision.  EARS, NOSE, AND THROAT: No tinnitus or ear pain.  RESPIRATORY: No cough, shortness of breath CARDIOVASCULAR: No chest pain, orthopnea, edema.  GASTROINTESTINAL: No nausea, vomiting, diarrhea or abdominal pain.  NEUROLOGIC: No tingling, numbness, weakness.  Unable to speak  MEDICATIONS AT HOME:   Prior to Admission medications   Medication Sig Start Date End Date Taking? Authorizing Provider  aspirin EC 81 MG tablet Take 81 mg by mouth daily.   Yes [provider]  feeding supplement, ENSURE ENLIVE, (ENSURE ENLIVE) LIQD Take 237 mLs by mouth 2 (two) times daily between meals. 03/07/17  Yes Mody, Ulice Bold, MD  metoprolol succinate (TOPROL-XL) 25 MG 24 hr tablet Take 25 mg by mouth daily. 03/03/17  Yes [provider]      VITAL SIGNS:  Blood pressure 130/60, pulse 60, temperature 97.6 F (36.4 C), temperature source Axillary, resp. rate (!) 24, SpO2  98 %.  PHYSICAL EXAMINATION:  GENERAL:  85 y.o.-year-old patient lying in the bed with no acute distress.  EYES: Pupils equal, round, reactive to light and accommodation. No scleral icterus. Extraocular muscles intact.  HEENT: Head atraumatic, normocephalic. Oropharynx and nasopharynx clear.  NECK:  Supple, no jugular venous distention. No thyroid enlargement, no tenderness.  LUNGS: Normal breath sounds bilaterally, no wheezing, rales,rhonchi or crepitation. No use of accessory muscles of respiration.  CARDIOVASCULAR: S1, S2 normal. No murmurs, rubs, or gallops.  ABDOMEN: Soft,  nontender, nondistended. Bowel sounds present.   EXTREMITIES: Left knee well-healed surgical scar , subcutaneous nodule on the kneecap , no discharge no pedal edema, cyanosis, or clubbing.  NEUROLOGIC: Cranial nerves II through XII are intact. Muscle strength at his baseline in all extremities. Sensation intact. Gait not checked.  Positive aphasia PSYCHIATRIC: The patient is alert and oriented x 3.  SKIN: No obvious rash, lesion, or ulcer.   LABORATORY PANEL:   CBC Recent Labs  Lab 10/04/18 0818  WBC 11.1*  HGB 10.2*  HCT 32.1*  PLT 111*   ------------------------------------------------------------------------------------------------------------------  Chemistries  Recent Labs  Lab 10/04/18 0818  NA 138  K 4.8  CL 109  CO2 23  GLUCOSE 95  BUN 39*  CREATININE 1.75*  CALCIUM 8.6*  AST 95*  ALT 47*  ALKPHOS 107  BILITOT 1.0   ------------------------------------------------------------------------------------------------------------------  Cardiac Enzymes Recent Labs  Lab 10/04/18 0818  TROPONINI 0.36*   ------------------------------------------------------------------------------------------------------------------  RADIOLOGY:  Ct Head Wo Contrast  Result Date: 10/04/2018 CLINICAL DATA:  Difficulty speaking. EXAM: CT HEAD WITHOUT CONTRAST TECHNIQUE: Contiguous axial images were obtained from the base of the skull through the vertex without intravenous contrast. COMPARISON:  CT scan of March 08, 2013. FINDINGS: Brain: Mild chronic ischemic white matter disease is noted. No mass effect or midline shift is noted. Ventricular size is within normal limits. There is no evidence of mass lesion, hemorrhage or acute infarction. Vascular: No hyperdense vessel or unexpected calcification. Skull: Normal. Negative for fracture or focal lesion. Sinuses/Orbits: Mild right maxillary and sphenoid sinusitis is noted. Other: None. IMPRESSION: Mild chronic ischemic white matter disease.  Mild right maxillary and sphenoid sinusitis. No acute intracranial abnormality seen. Electronically Signed   By: Marijo Conception, M.D.   On: 10/04/2018 09:11   Dg Chest Portable 1 View  Result Date: 10/04/2018 CLINICAL DATA:  Weakness. EXAM: PORTABLE CHEST 1 VIEW COMPARISON:  03/04/2017. FINDINGS: Cardiac pacer with lead tips over the right atrium right ventricle. Heart size normal. No pulmonary venous congestion. New nodular densities are noted over both lungs. Lower nodular densities may represent nipple shadows however other nodular densities are concerning for an active process including granulomas disease or metastatic disease. CT of the chest can be obtained to further evaluate. No pleural effusion or pneumothorax. Degenerative change thoracic spine. IMPRESSION: 1. Cardiac pacer with lead tips over the right atrium right ventricle. Heart size normal. No pulmonary venous congestion. 2. New nodular densities are noted over the lungs. These could represent a process such as granulomas or metastatic lesions. Nonenhanced CT of the chest can be obtained to further evaluate. Electronically Signed   By: Marcello Moores  Register   On: 10/04/2018 09:11    EKG:   Orders placed or performed during the hospital encounter of 10/04/18  . EKG 12-Lead  . EKG 12-Lead    IMPRESSION AND PLAN:     #Acute TIA versus CVA with aphasia Admit to MedSurg unit CT head is negative with acute stroke MRI/MRA  of the brain cannot be done as patient has pacemaker We will get carotid Dopplers and 2D echocardiogram Neurochecks Neurology consult placed to notify Dr. Doy Mince he is aware of the consult TSH, hemoglobin A1c, lipid panel check Speech therapy evaluation as patient could not pass bedside swallow evaluation PT OT evaluation Allow permissive hypertension Aspirin 300 mg rectally once if not given in the ED  #Acute cystitis Urine culture and sensitivity, IV Rocephin, IV fluids  #Acute sinusitis IV Rocephin and  symptomatic treatment  #Acute kidney injury on chronic kidney disease stage III Gentle hydration with IV fluids with close monitoring for symptoms and signs of fluid overload as the patient has history of congestive heart failure systolic  #Lung nodules-granulomatous versus mets Patient , his spouse, healthcare power of attorney-nephew has decided not to pursue further.  Denied CT chest at this time  #Elevated troponin could be demand ischemia We will monitor patient on telemetry and cycle cardiac biomarkers.  Patient is asymptomatic with no chest pain or shortness of breath at this time  #Essential hypertension currently patient is hypotensive and n.p.o. provide gentle hydration with IV fluids and monitor for symptoms and signs of fluid overload  DVT prophylaxis with Lovenox subcu  All the records are reviewed and case discussed with ED provider. Management plans discussed with the patient, family and they are in agreement.  CODE STATUS: dnr   TOTAL TIME TAKING CARE OF THIS PATIENT: 43  minutes.   Note: This dictation was prepared with Dragon dictation along with smaller phrase technology. Any transcriptional errors that result from this process are unintentional.  Nicholes Mango M.D on 10/04/2018 at 11:20 AM  Between 7am to 6pm - Pager - [phone]  After 6pm go to [email or web address] - password [email or web address] [facility]  [facility]  Office  [phone]  CC: Primary care physician; Kirk Ruths, MD

## 2018-10-04 NOTE — ED Provider Notes (Signed)
[facility] Emergency Department Provider Note ____________________________________________   First MD Initiated Contact with Patient 10/04/18 [phone]     (approximate)  I have reviewed the triage vital signs and the nursing notes.   HISTORY  Chief Complaint Altered Mental Status  Level 5 caveat: History of present illness limited due to aphasia  HPI Marcus Cruz. is a 85 y.o. male with PMH as noted below who presents with altered mental status and apparent aphasia acutely this morning.  The wife states that she last saw him at his baseline last night sometime, and then when she awoke this morning the patient had been in the bathroom for a while and was unable to get up off of the toilet.  She then found that he was unable to speak. The wife states that at baseline the patient is alert and fairly oriented, and is able to speak clearly.  The patient was in his normal state of health before this morning and has not had any symptoms over the last several days per the wife.  The patient is able to answer some questions with nodding, and he can follow commands.  He denies any pain.  Past Medical History:  Diagnosis Date  . Cancer [facility]) 2010   sarcoma resected from Left thigh.  . Hypertension     Patient Active Problem List   Diagnosis Date Noted  . Pyelonephritis 03/04/2017    Past Surgical History:  Procedure Laterality Date  . INSERT / REPLACE / REMOVE PACEMAKER    . PACEMAKER INSERTION    . sarcoma surgery Left 2011    Prior to Admission medications   Medication Sig Start Date End Date Taking? Authorizing Provider  aspirin EC 81 MG tablet Take 81 mg by mouth daily.   Yes [provider]  feeding supplement, ENSURE ENLIVE, (ENSURE ENLIVE) LIQD Take 237 mLs by mouth 2 (two) times daily between meals. 03/07/17  Yes Mody, Ulice Bold, MD  metoprolol succinate (TOPROL-XL) 25 MG 24 hr tablet Take 25 mg by mouth daily. 03/03/17  Yes [provider]    Allergies Patient has no known allergies.  Family History  Problem Relation Age of Onset  . CAD Father     Social History Social History   Tobacco Use  . Smoking status: Former Smoker    Last attempt to quit: 2008    Years since quitting: 12.0  . Smokeless tobacco: Never Used  Substance Use Topics  . Alcohol use: Yes    Comment: rarely  . Drug use: No    Review of Systems Level 5 caveat: Unable to obtain review of systems due to aphasia    ____________________________________________   PHYSICAL EXAM:  VITAL SIGNS: ED Triage Vitals [10/04/18 0807]  Enc Vitals Group     BP (!) 146/81     Pulse Rate 87     Resp 16     Temp      Temp Source Oral     SpO2 98 %     Weight      Height      Head Circumference      Peak Flow      Pain Score      Pain Loc      Pain Edu?      Excl. in [place]?     Constitutional: Alert, following commands.  Weak appearing. Eyes: Conjunctivae are normal.  EOMI.  PERRLA. Head: Atraumatic. Nose: No congestion/rhinnorhea. Mouth/Throat: Mucous membranes are dry.  Neck: Normal range of motion.  Cardiovascular: Normal rate, regular rhythm. Grossly normal heart sounds.  Good peripheral circulation. Respiratory: Normal respiratory effort.  No retractions. Lungs CTAB. Gastrointestinal: Soft and nontender. No distention.  Genitourinary: No flank tenderness. Musculoskeletal: No lower extremity edema.  Extremities warm and well perfused.  Left thigh mass. Neurologic: Motor intact in all extremities with slight weakness to right upper extremity when compared to left.  Facial asymmetry with no obvious acute facial droop.  Able to make sounds but not speak clear words. Skin:  Skin is warm and dry. No rash noted. Psychiatric: Calm and cooperative.  ____________________________________________   LABS (all labs ordered are listed, but only abnormal results are displayed)  Labs Reviewed  BASIC METABOLIC PANEL - Abnormal;  Notable for the following components:      Result Value   BUN 39 (*)    Creatinine, Ser 1.75 (*)    Calcium 8.6 (*)    GFR calc non Af Amer 32 (*)    GFR calc Af Amer 38 (*)    All other components within normal limits  CBC WITH DIFFERENTIAL/PLATELET - Abnormal; Notable for the following components:   WBC 11.1 (*)    RBC 3.32 (*)    Hemoglobin 10.2 (*)    HCT 32.1 (*)    Platelets 111 (*)    Monocytes Absolute 1.8 (*)    All other components within normal limits  TROPONIN I - Abnormal; Notable for the following components:   Troponin I 0.36 (*)    All other components within normal limits  HEPATIC FUNCTION PANEL - Abnormal; Notable for the following components:   Total Protein 6.2 (*)    Albumin 2.9 (*)    AST 95 (*)    ALT 47 (*)    All other components within normal limits  URINALYSIS, COMPLETE (UACMP) WITH MICROSCOPIC - Abnormal; Notable for the following components:   Color, Urine YELLOW (*)    APPearance TURBID (*)    Hgb urine dipstick LARGE (*)    Protein, ur 30 (*)    Leukocytes, UA LARGE (*)    RBC / HPF >50 (*)    WBC, UA >50 (*)    Bacteria, UA RARE (*)    All other components within normal limits  URINE CULTURE  LACTIC ACID, PLASMA  PROTIME-INR   ____________________________________________  EKG  ED ECG REPORT I, Arta Silence, the attending physician, personally viewed and interpreted this ECG.  Date: 10/04/2018 EKG Time: 0810 Rate: 74 Rhythm: AV dual paced rhythm ST/T Wave abnormalities: normal Narrative Interpretation: no evidence of acute ischemia  ____________________________________________  RADIOLOGY  CT head: No ICH or other acute abnormality CXR: New nodular densities; no focal infiltrate  ____________________________________________   PROCEDURES  Procedure(s) performed: No  Procedures  Critical Care performed: No ____________________________________________   INITIAL IMPRESSION / ASSESSMENT AND PLAN / ED  COURSE  Pertinent labs & imaging results that were available during my care of the patient were reviewed by me and considered in my medical decision making (see chart for details).  86 year old male with PMH as noted above presents with altered mental status and possible aphasia/dysarthria after his wife found him in the bathroom this morning unable to get up.  He was last seen at his baseline sometime last night.  I reviewed the past medical records in Epic; the patient was most recently admitted in 2018 with pyelonephritis.  On exam the patient is alert and responsive.  He is following commands and able  to answer some questions with nodding or shaking his head.  His vital signs are normal.  He appears aphasic although he is able to moan and make some sounds with his mouth, but not speak words.  He does have some facial asymmetry but no obvious acute droop.  He has mild weakness in the right upper extremity.  The remainder of the neuro exam is unremarkable.  Overall I am most concerned for acute CVA although the patient is out of the window for thrombolysis.  Differential also includes infection/sepsis, dehydration or other metabolic etiology, or cardiac cause.  We will obtain CT head, lab work-up, give fluids, and reassess.  ----------------------------------------- 10:33 AM on 10/04/2018 -----------------------------------------  CT head is negative.  The work-up otherwise reveals UA consistent with likely UTI.  The patient's creatinine is slightly elevated from prior labs.  His troponin is also elevated although I suspect this is most likely demand ischemia as the patient is not having any chest pain or shortness of breath.  I ordered ceftriaxone after reviewing urine cultures from 2018.  The patient continues to appear relatively comfortable.  Given the UTI, elevated troponin, and the new aphasia he will require admission.  I signed the patient out to the hospitalist Dr. Margaretmary Eddy at  approximately 10:30 AM. ____________________________________________   FINAL CLINICAL IMPRESSION(S) / ED DIAGNOSES  Final diagnoses:  Aphasia  Dehydration  Elevated troponin  Urinary tract infection without hematuria, site unspecified      NEW MEDICATIONS STARTED DURING THIS VISIT:  New Prescriptions   No medications on file     Note:  This document was prepared using Dragon voice recognition software and may include unintentional dictation errors.    Arta Silence, MD 10/04/18 1034

## 2018-10-04 NOTE — Consult Note (Addendum)
Referring Physician: Nicholes Mango MD    Chief Complaint: Altered mental status  HPI: Marcus Cruz. is an 85 y.o. 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 Per patient's wife who provides history as patient is non-verbal, patient went to bed last night and woke up at around 6:30 -7 am without any issues. Per patient's wife, patient went to the bathroom however took so long to come out so the wife went to check on him. She report that patient had a "funny look" in his eyes and was non-verbal, she tried to stimulate him but could not get much out of him so she called family who advised her to bring him to the ED for further evaluation. Patient's wife report no associated facial droop, cranial nerve deficit, seizures like activity, focal motor or sensory deficits, vision disturbances, nausea or vomiting, syncope or LOC, paresthesia (numbness, tingling, pins-and-needles sensation) or a heavy feeling in an extremity. On arrival to the ED patient was able to follow simple commands but was nonverbal. Initial NIHSS was 4. CT head did not show acute intracranial abnormality. Unable to obtain MRI brain due to pacemaker. Per family, patitn was able to move all extremities however he is noted to have weakness on the right which is a new finding since being evaluated in the ED.  Date last known well: Date: 10/03/2018 Time last known well: Unable to determine tPA Given: No: outside window period.  Past Medical History:  Diagnosis Date  . Cancer [facility]) 2010   sarcoma resected from Left thigh.  . Hypertension     Past Surgical History:  Procedure Laterality Date  . INSERT / REPLACE / REMOVE PACEMAKER    . PACEMAKER INSERTION    . sarcoma surgery Left 2011     Family History  Problem Relation Age of Onset  . CAD Father    Social History:  reports that he quit smoking about 12 years ago. He has never used smokeless tobacco. He reports current alcohol use. He reports that he does not use drugs.  Allergies: No Known Allergies  Medications:  I have reviewed the patient's current medications. Prior to Admission:  Medications Prior to Admission  Medication Sig Dispense Refill Last Dose  . aspirin EC 81 MG tablet Take 81 mg by mouth daily.   10/03/2018 at 0800  . feeding supplement, ENSURE ENLIVE, (ENSURE ENLIVE) LIQD Take 237 mLs by mouth 2 (two) times daily between meals. 237 mL 12 Past Month at Unknown time  . metoprolol succinate (TOPROL-XL) 25 MG 24 hr tablet Take 25 mg by mouth daily.   10/03/2018 at 0800   Scheduled: . aspirin  300 mg Rectal Once  . enoxaparin (LOVENOX) injection  30 mg Subcutaneous Q24H  . [START ON 10/05/2018] Influenza vac split quadrivalent PF  0.5 mL Intramuscular Tomorrow-1000    ROS: Unable to obtain from patient due to current altered mental status  Physical Examination: Blood pressure 130/60, pulse 60, temperature 97.6 F (36.4 C), temperature source Axillary, resp. rate (!) 24, SpO2 98 %.   HEENT-  Normocephalic, no lesions, without obvious abnormality.  Normal external eye and conjunctiva.  Normal TM's bilaterally.  Normal auditory canals and external ears. Normal external nose, mucus membranes and septum.  Normal pharynx. Cardiovascular- S1, S2 normal, pulses palpable throughout  Lungs- chest clear, no wheezing, rales, normal symmetric air entry Abdomen- soft, non-tender; bowel sounds normal; no masses,  no organomegaly,  Extremities- Left thigh sarcoma Lymph-no adenopathy palpable Musculoskeletal-no joint tenderness, deformity or swelling Skin-warm and dry, no hyperpigmentation, vitiligo, or suspicious lesions  Neurological Exam   Mental Status: Alert, unable to assess orientation as patient is  aphasic.  Able to follow 3 step commands without difficulty. Able to show thumb and stick tongue to commands. Attention span and concentration seemed appropriate  Cranial Nerves: II: Discs flat bilaterally; Visual fields grossly normal, pupils equal, round, reactive to light and accommodation III,IV, VI: ptosis not present, extra-ocular motions intact bilaterally V,VII: smile symmetric, facial light touch sensation intact VIII: hearing normal bilaterally IX,X: gag reflex present XI: bilateral shoulder shrug XII: midline tongue extension Motor: Right :  Upper extremity   0/5 does not break gravity     Left: Upper extremity   5/5 without pronator drift Right:   Lower extremity   1/5  able to withdraw but does not break gravity                                        Left: Lower extremity   4+/5 Tone and bulk:normal tone throughout; no atrophy noted Sensory: Pinprick and light touch intact bilaterally Deep Tendon Reflexes: 2+ and symmetric throughout Plantars: Right: mute                              Left: mute Cerebellar: Unable to assess  Gait: not tested due to safety concerns  Data Reviewed  Laboratory Studies:  Basic Metabolic Panel: Recent Labs  Lab 10/04/18 0818  NA 138  K 4.8  CL 109  CO2 23  GLUCOSE 95  BUN 39*  CREATININE 1.75*  CALCIUM 8.6*    Liver Function Tests: Recent Labs  Lab 10/04/18 0818  AST 95*  ALT 47*  ALKPHOS 107  BILITOT 1.0  PROT 6.2*  ALBUMIN 2.9*   No results for input(s): LIPASE, AMYLASE in the last 168 hours. No results for input(s): AMMONIA in the last 168 hours.  CBC: Recent Labs  Lab 10/04/18 0818  WBC 11.1*  NEUTROABS 6.2  HGB 10.2*  HCT 32.1*  MCV 96.7  PLT 111*    Cardiac Enzymes: Recent Labs  Lab 10/04/18 0818  TROPONINI 0.36*    BNP: Invalid input(s): POCBNP  CBG: No results for input(s): GLUCAP in the last 168 hours.  Microbiology: Results for orders placed or performed during the hospital encounter of  03/04/17  Urine Culture     Status: Abnormal   Collection Time: 03/04/17  2:33 PM  Result Value Ref Range Status   Specimen Description URINE, RANDOM  Final   Special Requests NONE  Final   Culture >=100,000 COLONIES/mL ESCHERICHIA COLI (A)  Final   Report Status 03/07/2017 FINAL  Final   Organism ID, Bacteria ESCHERICHIA COLI (A)  Final      Susceptibility   Escherichia coli - MIC*    AMPICILLIN >=32 RESISTANT Resistant     CEFAZOLIN 16 SENSITIVE Sensitive     CEFTRIAXONE <=1 SENSITIVE Sensitive     CIPROFLOXACIN <=0.25 SENSITIVE Sensitive     GENTAMICIN <=1 SENSITIVE Sensitive     IMIPENEM <=0.25 SENSITIVE Sensitive     NITROFURANTOIN <=16 SENSITIVE Sensitive     TRIMETH/SULFA >=320 RESISTANT  Resistant     AMPICILLIN/SULBACTAM >=32 RESISTANT Resistant     PIP/TAZO 16 SENSITIVE Sensitive     Extended ESBL NEGATIVE Sensitive     * >=100,000 COLONIES/mL ESCHERICHIA COLI  Blood culture (routine x 2)     Status: Abnormal   Collection Time: 03/04/17  6:10 PM  Result Value Ref Range Status   Specimen Description BLOOD BLOOD RIGHT FOREARM  Final   Special Requests   Final    BOTTLES DRAWN AEROBIC AND ANAEROBIC Blood Culture adequate volume   Culture  Setup Time   Final    GRAM NEGATIVE RODS AEROBIC BOTTLE ONLY CRITICAL RESULT CALLED TO, READ BACK BY AND VERIFIED WITH: Charlane Ferretti @ 1525 03/05/17 by [facility]    Culture ESCHERICHIA COLI (A)  Final   Report Status 03/07/2017 FINAL  Final   Organism ID, Bacteria ESCHERICHIA COLI  Final      Susceptibility   Escherichia coli - MIC*    AMPICILLIN >=32 RESISTANT Resistant     CEFAZOLIN 16 SENSITIVE Sensitive     CEFEPIME <=1 SENSITIVE Sensitive     CEFTAZIDIME <=1 SENSITIVE Sensitive     CEFTRIAXONE <=1 SENSITIVE Sensitive     CIPROFLOXACIN <=0.25 SENSITIVE Sensitive     GENTAMICIN <=1 SENSITIVE Sensitive     IMIPENEM <=0.25 SENSITIVE Sensitive     TRIMETH/SULFA >=320 RESISTANT Resistant     AMPICILLIN/SULBACTAM >=32 RESISTANT Resistant      PIP/TAZO <=4 SENSITIVE Sensitive     Extended ESBL NEGATIVE Sensitive     * ESCHERICHIA COLI  Blood culture (routine x 2)     Status: None   Collection Time: 03/04/17  6:10 PM  Result Value Ref Range Status   Specimen Description BLOOD RIGHT ANTECUBITAL  Final   Special Requests   Final    BOTTLES DRAWN AEROBIC AND ANAEROBIC Blood Culture adequate volume   Culture NO GROWTH 5 DAYS  Final   Report Status 03/09/2017 FINAL  Final  Blood Culture ID Panel (Reflexed)     Status: Abnormal   Collection Time: 03/04/17  6:10 PM  Result Value Ref Range Status   Enterococcus species NOT DETECTED NOT DETECTED Final   Listeria monocytogenes NOT DETECTED NOT DETECTED Final   Staphylococcus species NOT DETECTED NOT DETECTED Final   Staphylococcus aureus (BCID) NOT DETECTED NOT DETECTED Final   Streptococcus species NOT DETECTED NOT DETECTED Final   Streptococcus agalactiae NOT DETECTED NOT DETECTED Final   Streptococcus pneumoniae NOT DETECTED NOT DETECTED Final   Streptococcus pyogenes NOT DETECTED NOT DETECTED Final   Acinetobacter baumannii NOT DETECTED NOT DETECTED Final   Enterobacteriaceae species DETECTED (A) NOT DETECTED Final    Comment: Enterobacteriaceae represent a large family of gram-negative bacteria, not a single organism. CRITICAL RESULT CALLED TO, READ BACK BY AND VERIFIED WITH: Charlane Ferretti @ 1525 03/05/17 by [facility]    Enterobacter cloacae complex NOT DETECTED NOT DETECTED Final   Escherichia coli DETECTED (A) NOT DETECTED Final    Comment: CRITICAL RESULT CALLED TO, READ BACK BY AND VERIFIED WITH: Charlane Ferretti @ 1525 03/05/17 by [place]    Klebsiella oxytoca NOT DETECTED NOT DETECTED Final   Klebsiella pneumoniae NOT DETECTED NOT DETECTED Final   Proteus species NOT DETECTED NOT DETECTED Final   Serratia marcescens NOT DETECTED NOT DETECTED Final   Carbapenem resistance NOT DETECTED NOT DETECTED Final   Haemophilus influenzae NOT DETECTED NOT DETECTED Final   Neisseria  meningitidis NOT DETECTED NOT DETECTED Final   Pseudomonas aeruginosa NOT DETECTED NOT DETECTED  Final   Candida albicans NOT DETECTED NOT DETECTED Final   Candida glabrata NOT DETECTED NOT DETECTED Final   Candida krusei NOT DETECTED NOT DETECTED Final   Candida parapsilosis NOT DETECTED NOT DETECTED Final   Candida tropicalis NOT DETECTED NOT DETECTED Final    Coagulation Studies: Recent Labs    10/04/18 0818  LABPROT 13.3  INR 1.02    Urinalysis:  Recent Labs  Lab 10/04/18 0825  COLORURINE YELLOW*  LABSPEC 1.011  PHURINE 5.0  GLUCOSEU NEGATIVE  HGBUR LARGE*  BILIRUBINUR NEGATIVE  KETONESUR NEGATIVE  PROTEINUR 30*  NITRITE NEGATIVE  LEUKOCYTESUR LARGE*    Lipid Panel: No results found for: CHOL, TRIG, HDL, CHOLHDL, VLDL, LDLCALC  HgbA1C: No results found for: HGBA1C  Urine Drug Screen:  No results found for: LABOPIA, COCAINSCRNUR, LABBENZ, AMPHETMU, THCU, LABBARB  Alcohol Level: No results for input(s): ETH in the last 168 hours.  Other results: EKG: unchanged from previous tracings. A-V dual paced rythm. Vent. rate 74 BPM PR interval * ms QRS duration 166 ms QT/QTc 445/494 ms P-R-T axes 114 -79 96  Imaging: Ct Head Wo Contrast  Result Date: 10/04/2018 CLINICAL DATA:  Difficulty speaking. EXAM: CT HEAD WITHOUT CONTRAST TECHNIQUE: Contiguous axial images were obtained from the base of the skull through the vertex without intravenous contrast. COMPARISON:  CT scan of March 08, 2013. FINDINGS: Brain: Mild chronic ischemic white matter disease is noted. No mass effect or midline shift is noted. Ventricular size is within normal limits. There is no evidence of mass lesion, hemorrhage or acute infarction. Vascular: No hyperdense vessel or unexpected calcification. Skull: Normal. Negative for fracture or focal lesion. Sinuses/Orbits: Mild right maxillary and sphenoid sinusitis is noted. Other: None. IMPRESSION: Mild chronic ischemic white matter disease. Mild right  maxillary and sphenoid sinusitis. No acute intracranial abnormality seen. Electronically Signed   By: Marijo Conception, M.D.   On: 10/04/2018 09:11   Dg Chest Portable 1 View  Result Date: 10/04/2018 CLINICAL DATA:  Weakness. EXAM: PORTABLE CHEST 1 VIEW COMPARISON:  03/04/2017. FINDINGS: Cardiac pacer with lead tips over the right atrium right ventricle. Heart size normal. No pulmonary venous congestion. New nodular densities are noted over both lungs. Lower nodular densities may represent nipple shadows however other nodular densities are concerning for an active process including granulomas disease or metastatic disease. CT of the chest can be obtained to further evaluate. No pleural effusion or pneumothorax. Degenerative change thoracic spine. IMPRESSION: 1. Cardiac pacer with lead tips over the right atrium right ventricle. Heart size normal. No pulmonary venous congestion. 2. New nodular densities are noted over the lungs. These could represent a process such as granulomas or metastatic lesions. Nonenhanced CT of the chest can be obtained to further evaluate. Electronically Signed   By: Marcello Moores  Register   On: 10/04/2018 09:11   Patient seen and examined.  Clinical course and management discussed.  Necessary edits performed.  I agree with the above.  Assessment and plan of care developed and discussed below.    Assessment: 85 y.o. 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 Concerns for ischemic event. CT head reviewed and shows no acute intracranial abnormality. Unable to obtain MRI brain due to pacemaker.  Stroke Risk Factors - family history, hyperlipidemia and hypertension  Plan: 1. HgbA1c, fasting  lipid panel 2. Prophylactic  therapy-Antiplatelet med: Aspirin 300mg  per rectum daily 3. PT consult, OT consult, Speech consult 4. Echocardiogram 5. Carotid dopplers 6. NPO until RN stroke swallow screen 7. Telemetry monitoring 8. Frequent neuro checks    10/04/2018, 11:51 AM  Patient presented with difficulty with speech outside time window for tPA.  Acute infarct with embolic etiology suspected.  Has declined further since admission.  Conversation had at length with wife and multiple step-children. Although patient within time window for possible thrombectomy, family does not believe that patient would have wanted such aggressive measures.  With that in mind patient not sent for further vascular imaging such as CTA.CTP.    Recommendations: 1. Repeat head CT without contrast in AM 2. Consider palliative consult   Alexis Goodell, MD Neurology [phone]  10/04/2018  7:17 PM

## 2018-10-04 NOTE — ED Triage Notes (Signed)
PT to ED via EMS from home c/o AMS. Per EMS pt is A&O but unable to speak. Per family pt is usually verbal. LKW was last night per family.

## 2018-10-04 NOTE — ED Notes (Signed)
ED Provider at bedside. 

## 2018-10-04 NOTE — Progress Notes (Signed)
Palliative care:  Consult received and chart reviewed. Spoke with patient's nephew, Heide Guile. He tells me he is HCPOA. He is not at the hospital now but tells me he can meet tomorrow (1/29) around 10:30 AM. We discussed pending tests. Will plan on full [place] conversation tomorrow.  Thank you for this consult.  Juel Burrow, DNP, AGNP-C Palliative Medicine Team Team Phone # [phone]  Pager # [phone]  NO CHARGE

## 2018-10-04 NOTE — Progress Notes (Signed)
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  They all verbalized understanding of the plan.  Patient's progosis: Unable to determine and Goals for treatment: DNR  Patient's nephew Judie Petit  is the healthcare power of attorney  Additional follow-up to be provided: Hospitalist, neurologist, speech therapist  Time spent during discussion:17 min  Nicholes Mango, MD

## 2018-10-04 NOTE — ED Notes (Signed)
Pt given urinal at this time, will continue to monitor for urine sample

## 2018-10-04 NOTE — Progress Notes (Signed)
Ch received a OR for pt in regards to [employer]. Pt was NOT responsive yet family shared that he was just driving yesterday. Ch emphasized that pt has to hv mental capacity to complete adv directive. Pt family were understanding and just needed clarity on the options for the pt ACP. Ch mentioned that it would be best if they contact the nephew before starting adv directive here. Pt family were grateful for the time the ch spent and received a adv dir to take home to review at a later time.     10/04/18 1400  Clinical Encounter Type  Visited With Patient and family together  Visit Type Psychological support;Spiritual support  Referral From Physician  Consult/Referral To Chaplain  Spiritual Encounters  Spiritual Needs Emotional;Grief support  Stress Factors  Patient Stress Factors Family relationships;Financial concerns;Health changes;Major life changes  Family Stress Factors Major life changes;Loss of control;Family relationships  Advance Directives (For Healthcare)  Does Patient Have a Medical Advance Directive? No  Does patient want to make changes to medical advance directive? Yes (Inpatient - patient defers changing a medical advance directive at this time)  Type of Advance Directive [place];Living will  Copy of [place] in Chart? No - copy requested  Healthcare Power of Attorney Requested and Now in Chart  (pt family will consult with the nephew for ACP)  Copy of Living Will in Chart? No - copy requested  Would patient like information on creating a medical advance directive? Yes (Inpatient - patient defers creating a medical advance directive at this time)

## 2018-10-04 NOTE — ED Notes (Signed)
Date and time results received: 10/04/18 0905 (use smartphrase ".now" to insert current time)  Test: troponin Critical Value: 0.36  Name of Provider Notified: Siadecki

## 2018-10-04 NOTE — Evaluation (Signed)
Physical Therapy Evaluation Patient Details Name: Marcus Cruz. MRN: [id number] DOB: [birth year] Today's Date: 10/04/2018   History of Present Illness  85 y.o. 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   Clinical Impression  Pt with negative CT for CVA, unable to get MRI but appears to have symptoms congruent with stroke.  Most notedly he is now non-verbal (though able to follow instructions and indicate some needs), has significant R UE weakness and displays lean to the R with all balance activities. He apparently was very active and independent and family is hoping to take him home but realize how unsafe that would be at this time and that he will need STR before going home would be a possibility.      Follow Up Recommendations SNF    Equipment Recommendations  (TBD at rehab, will likely need hemi-walker)    Recommendations for Other Services       Precautions / Restrictions Precautions Precautions: Fall Restrictions Weight Bearing Restrictions: No      Mobility  Bed Mobility Overal bed mobility: Needs Assistance Bed Mobility: Supine to Sit     Supine to sit: Min assist     General bed mobility comments: Pt showed great effort in getting to EOB, and was actually able to get most of the way up but inability to use R UE necessitated assist to get to (and maintain sitting)   Transfers Overall transfer level: Needs assistance Equipment used: Hemi-walker Transfers: Sit to/from Stand Sit to Stand: +2 physical assistance;Min assist;Mod assist         General transfer comment: Pt showed good effort in getting up from elevated surface, needed constant assist to maintain standing once up.  Ambulation/Gait Ambulation/Gait assistance: Mod assist;+2 physical  assistance Gait Distance (Feet): 3 Feet Assistive device: Hemi-walker       General Gait Details: Pt leaning to the R heavily and though he showed great effort and willingness to try he ultimately needed heavy assist to move HW, advance either LE minimally and generally speaking was not at all safe   Stairs            Wheelchair Mobility    Modified Rankin (Stroke Patients Only)       Balance Overall balance assessment: Needs assistance Sitting-balance support: Single extremity supported(unable to effectively use R) Sitting balance-Leahy Scale: Poor     Standing balance support: Single extremity supported Standing balance-Leahy Scale: Poor Standing balance comment: L UE on hemiwalker, leaning heavily to the R the entire time standing                             Pertinent Vitals/Pain      Home Living Family/patient expects to be discharged to:: Skilled nursing facility Living Arrangements: Spouse/significant other               Additional Comments: occasionally uses SPC    Prior Function Level of Independence: Independent         Comments: Pt drives, is out of the home QD, able to be active     Hand Dominance   Dominant Hand: Right    Extremity/Trunk Assessment   Upper Extremity Assessment Upper Extremity Assessment: RUE deficits/detail(L UE grossly 4/5 t/o) RUE Deficits / Details: limited AROM  in R, grossly 2/5 t/o, poor grip, very different from baseline    Lower Extremity Assessment Lower Extremity Assessment: Generalized weakness(R grossly 4-/5, R grossly 4/5)       Communication   Communication: Expressive difficulties(unable to speak)  Cognition Arousal/Alertness: Awake/alert Behavior During Therapy: (pt unable to speak, but appropriately interactive as able) Overall Cognitive Status: (appears mentally intact, unable to speak)                                        General Comments      Exercises      Assessment/Plan    PT Assessment Patient needs continued PT services  PT Problem List Decreased strength;Decreased range of motion;Decreased activity tolerance;Decreased balance;Decreased mobility;Decreased coordination;Decreased safety awareness;Decreased knowledge of use of DME       PT Treatment Interventions DME instruction;Gait training;Stair training;Functional mobility training;Therapeutic activities;Therapeutic exercise;Balance training;Neuromuscular re-education;Cognitive remediation;Patient/family education    PT Goals (Current goals can be found in the Care Plan section)  Acute Rehab PT Goals Patient Stated Goal: unable to state, family would like to take him home PT Goal Formulation: With family Time For Goal Achievement: 10/18/18 Potential to Achieve Goals: Fair    Frequency 7X/week   Barriers to discharge        Co-evaluation               AM-PAC PT "6 Clicks" Mobility  Outcome Measure Help needed turning from your back to your side while in a flat bed without using bedrails?: A Little Help needed moving from lying on your back to sitting on the side of a flat bed without using bedrails?: A Lot Help needed moving to and from a bed to a chair (including a wheelchair)?: A Lot Help needed standing up from a chair using your arms (e.g., wheelchair or bedside chair)?: A Lot Help needed to walk in hospital room?: A Lot Help needed climbing 3-5 steps with a railing? : Total 6 Click Score: 12    End of Session Equipment Utilized During Treatment: Gait belt Activity Tolerance: Patient tolerated treatment well;Patient limited by fatigue Patient left: with chair alarm set;with call bell/phone within reach;with family/visitor present Nurse Communication: Mobility status PT Visit Diagnosis: Muscle weakness (generalized) (M62.81);Difficulty in walking, not elsewhere classified (R26.2);Other symptoms and signs involving the nervous system (R29.898);Hemiplegia and  hemiparesis Hemiplegia - Right/Left: Right Hemiplegia - dominant/non-dominant: Dominant Hemiplegia - caused by: Unspecified    Time: 1324-4010 PT Time Calculation (min) (ACUTE ONLY): 39 min   Charges:   PT Evaluation $PT Eval Low Complexity: 1 Low PT Treatments $Therapeutic Activity: 8-22 mins        Kreg Shropshire, DPT 10/04/2018, 5:48 PM

## 2018-10-05 ENCOUNTER — Inpatient Hospital Stay: Payer: Medicare Other

## 2018-10-05 ENCOUNTER — Inpatient Hospital Stay
Admit: 2018-10-05 | Discharge: 2018-10-05 | Disposition: A | Discharge status: Home / Self Care | Payer: Medicare Other | Attending: Internal Medicine | Admitting: Internal Medicine

## 2018-10-05 DIAGNOSIS — Z7189 Other specified counseling: Secondary | ICD-10-CM

## 2018-10-05 DIAGNOSIS — Z515 Encounter for palliative care: Secondary | ICD-10-CM

## 2018-10-05 DIAGNOSIS — R4701 Aphasia: Secondary | ICD-10-CM

## 2018-10-05 LAB — HEMOGLOBIN A1C
Hgb A1c MFr Bld: 5.1 % (ref 4.8–5.6)
Mean Plasma Glucose: 99.67 mg/dL

## 2018-10-05 LAB — LIPID PANEL
Cholesterol: 94 mg/dL (ref 0–200)
HDL: 27 mg/dL — ABNORMAL LOW (ref >40)
LDL Cholesterol: 52 mg/dL (ref 0–99)
TRIGLYCERIDES: 76 mg/dL (ref <150)
Total CHOL/HDL Ratio: 3.5 RATIO
VLDL: 15 mg/dL (ref 0–40)

## 2018-10-05 LAB — TROPONIN I
Troponin I: 0.33 ng/mL (ref <0.03)
Troponin I: 0.38 ng/mL (ref <0.03)

## 2018-10-05 LAB — ECHOCARDIOGRAM COMPLETE
Height: 72 in
WEIGHTICAEL: 4821.9 [oz_av]

## 2018-10-05 MED ORDER — MORPHINE SULFATE (PF) 2 MG/ML IV SOLN
1.0000 mg | Freq: Four times a day (QID) | INTRAVENOUS | Status: DC | PRN
  Administered 2018-10-05: 1 mg via INTRAVENOUS

## 2018-10-05 MED ORDER — ENOXAPARIN SODIUM 40 MG/0.4ML ~~LOC~~ SOLN: 40.0000 mg | Freq: Two times a day (BID) | SUBCUTANEOUS | Status: DC

## 2018-10-05 MED ORDER — SODIUM CHLORIDE 0.9 % IV SOLN
1.0000 g | INTRAVENOUS | Status: DC
  Administered 2018-10-05: 1 g via INTRAVENOUS
  Filled 2018-10-05: qty 10
  Filled 2018-10-05: qty 1

## 2018-10-05 MED ORDER — ENOXAPARIN SODIUM 30 MG/0.3ML ~~LOC~~ SOLN: 30.0000 mg | SUBCUTANEOUS | Status: DC

## 2018-10-05 MED ORDER — LORAZEPAM 2 MG/ML IJ SOLN
1.0000 mg | Freq: Four times a day (QID) | INTRAMUSCULAR | Status: DC | PRN
  Administered 2018-10-05 – 2018-10-06 (×2): 1 mg via INTRAVENOUS
  Filled 2018-10-05 (×2): qty 1

## 2018-10-05 NOTE — Consult Note (Signed)
Consultation Note Date: 10/05/2018   Patient Name: Marcus Cruz.  DOB: 06/04/1921  MRN: [id number]  Age / Sex: 85 y.o., male  PCP: Kirk Ruths, MD Referring Physician: Max Sane, MD  Reason for Consultation: Establishing goals of care and Hospice Evaluation  HPI/Patient Profile: 85 y.o. male  with past medical history of sarcoma and left thigh s/p resection, HTN, systolic heart failure, CKD, and complete heart block s/p pacemaker placement admitted on 10/04/2018 with aphasia. Initial head CT revealed no acute findings. Patient unable to have MRI d/t pacemaker. Repeat CT revealed new area of decreased attenuation within the left parietal lobe consistent with evolving acute to subacute ischemia. Also found to have UTI and AKI. CXR revealed new nodular lesions: granulomas vs metastatic lesions. Patient unable to tolerate POs - gurgling with attempts. PMT consulted for [place].  Clinical Assessment and Goals of Care: I have reviewed medical records including EPIC notes, labs and imaging, received report from RN and Dr. Manuella Ghazi, assessed the patient and then met with multiple family members  (patient, wife, nephew, niece) to discuss diagnosis prognosis, GOC, EOL wishes, disposition and options.  I introduced Palliative Medicine as specialized medical care for people living with serious illness. It focuses on providing relief from the symptoms and stress of a serious illness. The goal is to improve quality of life for both the patient and the family.  We discussed a brief life review of the patient. Family tells me he was married over 30 years and wife passed away. Remarried to his current wife and they have been married for 4 years. She has dementia. She tells me about how they met and got married. Tells me about his involvement with church. They all express their love and fondness for him.   Family had just had long, detailed conversation with Dr.  Manuella Ghazi about patient's clinical course and prognosis. We reviewed conversation briefly. Family has good understanding that patient is nearing end of life and all agree they want to focus on his comfort.  During our conversation they are most focused on patient having PO intake. We discussed risks involved with PO intake - risks of aspiration. Discussed PO intake as safely as possible to allow for patient's comfort. They agree. SLP coming to see to educate family about safest PO intake.   Patient appears agitated during our conversation - seems frustrated that he is not able to communicate. Provided emotional support to patient and family. Discussed medication for agitation. Will order.   Family has decided to take patient home with support of hospice after discussion with Dr. Manuella Ghazi. They have no further questions.   Questions and concerns were addressed. The family was encouraged to call with questions or concerns.   Primary Decision Maker HCPOA - nephew Antony Haste  SUMMARY OF RECOMMENDATIONS   - home with hospice - comfort feeds - prn ativan  Code Status/Advance Care Planning:  Limited code  Palliative Prophylaxis:   Aspiration, Delirium Protocol, Frequent Pain Assessment and Oral Care  Additional Recommendations (Limitations, Scope, Preferences):  Full Comfort Care  Psycho-social/Spiritual:   Desire for further Chaplaincy support:no  Additional Recommendations: Education on Hospice  Prognosis:   < 2 weeks  Discharge Planning: Home with Hospice      Primary Diagnoses: Present on Admission: . UTI (urinary tract infection)   I have reviewed the medical record, interviewed the patient and family, and examined the patient. The following aspects are pertinent.  Past Medical History:  Diagnosis Date  . Cancer (  [place]) 2010   sarcoma resected from Left thigh.  . Hypertension    Social History   Socioeconomic History  . Marital status: Married    Spouse name: Not on file  .  Number of children: Not on file  . Years of education: Not on file  . Highest education level: Not on file  Occupational History  . Not on file  Social Needs  . Financial resource strain: Not on file  . Food insecurity:    Worry: Not on file    Inability: Not on file  . Transportation needs:    Medical: Not on file    Non-medical: Not on file  Tobacco Use  . Smoking status: Former Smoker    Last attempt to quit: 2008    Years since quitting: 12.0  . Smokeless tobacco: Never Used  Substance and Sexual Activity  . Alcohol use: Yes    Comment: rarely  . Drug use: No  . Sexual activity: Not on file  Lifestyle  . Physical activity:    Days per week: Not on file    Minutes per session: Not on file  . Stress: Not on file  Relationships  . Social connections:    Talks on phone: Not on file    Gets together: Not on file    Attends religious service: Not on file    Active member of club or organization: Not on file    Attends meetings of clubs or organizations: Not on file    Relationship status: Not on file  Other Topics Concern  . Not on file  Social History Narrative  . Not on file   Family History  Problem Relation Age of Onset  . CAD Father    Scheduled Meds: . enoxaparin (LOVENOX) injection  30 mg Subcutaneous Q24H  . Influenza vac split quadrivalent PF  0.5 mL Intramuscular Tomorrow-1000   Continuous Infusions: . sodium chloride Stopped (10/05/18 0002)  . cefTRIAXone (ROCEPHIN)  IV Stopped (10/05/18 0101)   PRN Meds:.sodium chloride, [DISCONTINUED] acetaminophen **OR** [DISCONTINUED] acetaminophen (TYLENOL) oral liquid 160 mg/5 mL **OR** acetaminophen, LORazepam, morphine injection No Known Allergies Review of Systems  Unable to perform ROS: Patient nonverbal    Physical Exam Constitutional:      Appearance: He is cachectic.  HENT:     Head: Normocephalic and atraumatic.  Cardiovascular:     Rate and Rhythm: Normal rate.  Pulmonary:     Effort:  Pulmonary effort is normal.     Breath sounds: Normal breath sounds.  Musculoskeletal:     Right lower leg: No edema.     Left lower leg: No edema.  Skin:    General: Skin is warm and dry.  Neurological:     Mental Status: He is alert.  Psychiatric:        Behavior: Behavior is agitated.     Vital Signs: BP (!) 109/59   Pulse 87   Temp 97.7 F (36.5 C) (Oral)   Resp 20   Ht 6' (1.829 m)   Wt (!) 136.7 kg   SpO2 96%   BMI 40.87 kg/m  Pain Scale: PAINAD   Pain Score: Asleep   SpO2: SpO2: 96 % O2 Device:SpO2: 96 % O2 Flow Rate: .O2 Flow Rate (L/min): 0 L/min  IO: Intake/output summary:   Intake/Output Summary (Last 24 hours) at 10/05/2018 1108 Last data filed at 10/05/2018 0500 Gross per 24 hour  Intake 100.45 ml  Output 300 ml  Net -199.55 ml  LBM: Last BM Date: 10/04/18(per wife) Baseline Weight: Weight: (!) 136.7 kg Most recent weight: Weight: (!) 136.7 kg     Palliative Assessment/Data: PPS 20%    Time Total: 50 minutes Greater than 50%  of this time was spent counseling and coordinating care related to the above assessment and plan.  Juel Burrow, DNP, AGNP-C Palliative Medicine Team [phone] Pager: [phone]

## 2018-10-05 NOTE — Progress Notes (Deleted)
PHARMACY NOTE: RENAL DOSAGE ADJUSTMENT  Current antimicrobial regimen includes a mismatch between anticoagulant dosage and estimated renal function.  As per policy approved by the Pharmacy & Therapeutics and Medical Executive Committees, the anticoagulantl dosage will be adjusted accordingly.  Current anticoagulant dosage:  Enoxaparin 30mg  SQ Q24hr.   Indication: DVT prophylaxis   Renal Function:  Estimated Creatinine Clearance: 36.1 mL/min (A) (by C-G formula based on SCr of 1.75 mg/dL (H)).   BMI: 40.8    Anticoagulant dosage has been changed to:  Enoxaparin 40mg  SQ BID.    Thank you for allowing pharmacy to be a part of this patient's care.  Niraj Kudrna L, [facility] 10/05/2018 12:50 PM

## 2018-10-05 NOTE — Care Management Note (Signed)
Case Management Note  Patient Details  Name: Marcus Cruz. MRN: [id number] Date of Birth: 04-06-1921  Subjective/Objective:     Patient's family has decided on Hospice at home, choice offered and they choose [facility].  Flo Shanks notified of patient choice and will arrange equipment and services.   Heide Guile, nephew is the POA and he is at the bedside.  Hospital bed needed for home.  Doran Clay RN BSN [phone]             Action/Plan:   Expected Discharge Date:                  Expected Discharge Plan:  Home w Hospice Care  In-House Referral:     Discharge planning Services  CM Consult  Post Acute Care Choice:  Hospice Choice offered to:  [facility] POA / Guardian  DME Arranged:  Hospital bed DME Agency:     HH Arranged:    [place]:  [facility]  Status of Service:  Completed, signed off  If discussed at [place] of Stay Meetings, dates discussed:    Additional Comments:  Shelbie Hutching, RN 10/05/2018, 11:36 AM

## 2018-10-05 NOTE — Progress Notes (Signed)
New referral for [facility] services at home received from [facility]. Patient is a 85 year old man with PMH of sarcoma and left thigh s/p resection, HTN, systolic heart failure, CKD, and complete heart block s/p pacemaker placement admitted to [facility] from home on 1/28  with aphasia. CT in the ED revealed no acute findings. No MRI d/t pacemanker. Head  CT repeated on 1/29  revealed a new area of decreased attenuation within the left parietal lobe consistent with evolving acute to subacute ischemia. In addition, labs in the ED revealed aUTI and AKI and chest xray revealed new nodular lesions: granulomas vs metastatic lesions. He has been receiving IV antibiotics and IV fluids but has remained  aphasic with right sided weakness and dysphasia. Patient has been seen by Speech therapy and started on a pureed diet with thin liquids, family aware of continued high risk of aspiration. Palliative Medicine was consulted for goals of care and met with patient and his family, attending MD Dr. Manuella Ghazi also spoke with family, they have chosen to focus on comfort with the support of hospice services at home.  Writer met in the room with patient, his wife Lelon Frohlich, step daughter Clarise Cruz, step son Fritz Pickerel and nephew Antony Haste to initiate education regarding hospice services, philosophy and team approach to care with understanding voiced. Questions answered. DME requested includes fully electric hospital bed, top 1/2 rails and bed extension, over bed table and suction. DME reviewed  With family and plan is for delivery tomorrow morning. Patient was alert through out the visit, but unable to speak and did appear frustrated at times. He did shake his head yes when plan was outlined about going home with hospice services. Patient will require EMS transport with signed DNR in place. Hospital care team updated. Patient information faxed to referral. Will continue to follow through discharge. Flo Shanks RN, BSN, [place]  and [facility] [place], hospital Liaison [phone]

## 2018-10-05 NOTE — Evaluation (Signed)
Clinical/Bedside Swallow Evaluation Patient Details  Name: Marcus Cruz. MRN: [id number] Date of Birth: 09/25/1920  Today's Date: 10/05/2018 Time: SLP Start Time (ACUTE ONLY): 1110 SLP Stop Time (ACUTE ONLY): 1210 SLP Time Calculation (min) (ACUTE ONLY): 60 min  Past Medical History:  Past Medical History:  Diagnosis Date  . 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  . Hypertension    Past Surgical History:  Past Surgical History:  Procedure Laterality Date  . INSERT / REPLACE / REMOVE PACEMAKER    . PACEMAKER INSERTION    . sarcoma surgery Left 2011   HPI:  Pt  is a 85 y.o. 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  His spouse states that when she last saw him last night he was at his baseline without any difficulty with the speech.  Today morning he was unable to get out of the toilet seat feeling very weak and unable to speak.  Initial CT of the head with no acute findings.  Patient has pacemaker could not get MRI of the brain.  Urinalysis looks abnormal.  Renal function is worse than his baseline.  White blood cell count is elevated but lactic acid is normal at 1.8.  Patient was received IV Rocephin after urine culture and sensitivity.  Hospitalist team is called to admit the patient.  During my examination patient is able to answer his questions by nodding his head but unable to speak.  According to the ED nurse patient is gurgling could not pass bedside swallow evaluation.  Chest x-ray results discussed with the patient and his family members and they refused any further investigations at this time("New nodular densities are noted over the lungs"). Currently, pt remains nonverbal but gestures and nods head intermittently to indicate a need.  Noted a moderate+ congested cough at baseline; phlegm.    Assessment / Plan / Recommendation Clinical Impression  Pt appears to present w/ moderate - severe oropharyngeal phase dysphagia w/ high risk for aspiration w/ Pulmonary impact/decline. Pt exhibited overt coughing w/ trials of thin liquids via Cup despite attempted follow through w/ aspiration precautions. Pt was slightly impulsive when drinking wanting to take multiple sips. When SLP was able to direct and monitor bolus flow and volume to limit to single, small sips via Cup, pt appeared to have less coughing and choking episodes - coughing occurred immediately following po's, especially thin liquids. W/ trials of Puree in small TSP amounts, less coughing noted. W/ all trials, pt exhibited multiple swallows - suspect pharyngeal residue w/ need to use multiple swallows to clear the residue. Any pharyngeal residue can increase risk for aspiration of bolus residue after the swallow.  Oral phase was c/b moderately-severely decreased weakness limiting bolus control during bolus acceptance then bolus manipulation for timely management and A-P transfer. Min oral residue remained in sulci bilaterally. Anterior spillage noted during drinking of thin liquids d/t decreased labial closure on rim of Cup. Again, pt was given time b/t trials for oral clearing and f/u, dry swallows. Pt presented w/ R OM weakness during exam. Discussed at length w/ family present in room of pt's increased risk for aspiration of oral intake; risk for aspiration and Pulmonary decline. Further education and model of strategies utilized during po presentations was given during this evaluation  today. Practiced and discussed aspiration precautions as well as support strategies to assist pt during oral intake.  Per MD and family wishes, an oral diet was placed. Precautions posted and given to family for home use at discharge.  SLP Visit Diagnosis: Dysphagia, oropharyngeal phase (R13.12)     Aspiration Risk  Moderate aspiration risk;Severe aspiration risk;Risk for inadequate nutrition/hydration    Diet Recommendation  Dysphagia level 1(PUREE) w/ thin liquids; strict aspiration precautions - comfort care diet  Medication Administration: Crushed with puree(as able for easier, safer swallowing)    Other  Recommendations Recommended Consults: (Palliative Care/Hospice services in place) Oral Care Recommendations: Oral care BID;Staff/trained caregiver to provide oral care Other Recommendations: (n/a)   Follow up Recommendations None      Frequency and Duration (n/a)  (n/a)       Prognosis Prognosis for Safe Diet Advancement: Guarded Barriers to Reach Goals: Time post onset;Severity of deficits(CVA)      Swallow Study   General Date of Onset: 10/04/18 HPI: Pt  is a 85 y.o. 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  His spouse states that when she last saw him last night he was at his baseline without any difficulty with the speech.  Today morning he was unable to get out of the toilet seat feeling very weak and unable to speak.  Initial CT of the head with no acute findings.  Patient has pacemaker could not get MRI of the brain.  Urinalysis looks abnormal.  Renal function is worse than his baseline.  White blood cell count is elevated but lactic acid is normal at 1.8.  Patient was received IV Rocephin after urine culture and sensitivity.  Hospitalist team is called to admit the patient.  During my examination patient is able to answer his questions by nodding his head but unable to speak.  According to the ED nurse patient is gurgling could not pass bedside swallow evaluation.  Chest x-ray results discussed with the patient and his family members and  they refused any further investigations at this time("New nodular densities are noted over the lungs"). Currently, pt remains nonverbal but gestures and nods head intermittently to indicate a need. Noted a moderate+ congested cough at baseline; phlegm.  Type of Study: Bedside Swallow Evaluation Previous Swallow Assessment: none reported Diet Prior to this Study: NPO(regular diet at home) Temperature Spikes Noted: (wbc 11.1; temps wfl today) Respiratory Status: Room air History of Recent Intubation: No Behavior/Cognition: Cooperative;Pleasant mood;Distractible;Requires cueing(Awake) Oral Cavity Assessment: Dry Oral Care Completed by SLP: Recent completion by staff Oral Cavity - Dentition: Poor condition;Missing dentition Vision: Functional for self-feeding(grossly wfl - possible R neglect?) Self-Feeding Abilities: Able to feed self;Needs assist;Needs set up;Total assist(unable to use RUE; able to hold cup) Patient Positioning: Upright in bed(needed positioning) Baseline Vocal Quality: (nonverbal; gurgly phlegm noises at time) Volitional Cough: Cognitively unable to elicit Volitional Swallow: Unable to elicit    Oral/Motor/Sensory Function Overall Oral Motor/Sensory Function: Moderate impairment Facial ROM: Reduced right Facial Symmetry: Abnormal symmetry right Facial Strength: Reduced right Facial Sensation: Reduced right Lingual ROM: Reduced right Lingual Symmetry: (reduced overall) Lingual Strength: Reduced Velum: (CNT ) Mandible: Impaired(reduced ROM)   Ice Chips Ice chips: Impaired Presentation: Cup;Self Fed(2 trials) Oral Phase Impairments: Reduced labial seal;Reduced lingual movement/coordination;Poor awareness of bolus;Impaired mastication Oral Phase  Functional Implications: Prolonged oral transit;Oral holding(anterior spillage; decreased bolus control) Pharyngeal Phase Impairments: Cough - Immediate;Suspected delayed Swallow;Multiple swallows(x1)   Thin Liquid Thin Liquid:  Impaired Presentation: Cup;Self Fed(supported; ~3+ ozs) Oral Phase Impairments: Reduced labial seal;Reduced lingual movement/coordination;Poor awareness of bolus Oral Phase Functional Implications: Prolonged oral transit;Oral residue;Right lateral sulci pocketing;Left lateral sulci pocketing(decreased bolus control; anterior spillage) Pharyngeal  Phase Impairments: Suspected delayed Swallow;Multiple swallows;Cough - Immediate(x6 trials) Other Comments: pt was somewhat impulsive in feeding self the sips of water via Cup    Nectar Thick Nectar Thick Liquid: Not tested   Honey Thick Honey Thick Liquid: Not tested   Puree Puree: Impaired Presentation: Spoon(fed; 10 trials) Oral Phase Impairments: Reduced labial seal;Reduced lingual movement/coordination;Impaired mastication;Poor awareness of bolus Oral Phase Functional Implications: Prolonged oral transit;Oral residue Pharyngeal Phase Impairments: Suspected delayed Swallow;Multiple swallows;Cough - Delayed(x2 trials)   Solid     Solid: Not tested       Orinda Kenner, MS, CCC-SLP Watson,Katherine 10/05/2018,3:26 PM

## 2018-10-05 NOTE — Progress Notes (Signed)
Writer was approached by patient's step son Fritz Pickerel, who had questions about moving patient to the hospice home rather than home. Questions answered. Fritz Pickerel spoke with all other family members, including patient's wife and Hedy Camara, all are in agreement with transfer tomorrow to the hospice home. Consents signed. Patient will require EMS transport and signed out of facilit DNR. Hospital care team to be updated in the morning. Will continue to follow through discharge. Flo Shanks RN, BSN, [facility], hospital Liaison [phone]

## 2018-10-05 NOTE — Evaluation (Signed)
Occupational Therapy Evaluation Patient Details Name: Marcus Cruz. MRN: [id number] DOB: [birth year] Today's Date: 10/05/2018    History of Present Illness 85 y.o. 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    Clinical Impression   Pt seen for OT evaluation this date. Pt with significant aphasia and inability to verbalize other than shaking his head or using simple hand gestures with his L UE (waves his hand away for no; gives thumbs up for yes). Pt became tearful at end of session when unable to communicate. Pt seen by PT on 1/28 and noted to have some function in RUE (grossly 2/5 strength), however, on this date family indicated that RUE appears to have gotten worse. Pt with no AROM or palpable muscle contraction of RUE on evaluation. Prior to hospital admission, pt was modified independent in all aspects of ADL/IADL, using a SPC for mobility on occasion and driving. Family indicated pt has only had one fall in the last 12 months, which occurred about 2 weeks prior to hospital admission. Pt lives with his spouse in a 1 story home with level entry. Pt would benefit from skilled OT to address noted impairments and functional limitations (see below for any additional details) in order to maximize safety and independence while minimizing falls risk and caregiver burden. Upon hospital discharge, recommend [facility].     Follow Up Recommendations  Home health OT    Equipment Recommendations  3 in 1 bedside commode    Recommendations for Other Services       Precautions / Restrictions Precautions Precautions: Fall Restrictions Weight Bearing Restrictions: No      Mobility Bed Mobility Overal bed mobility: Needs Assistance             General bed mobility comments: Deferred  due to increased weakness and pt communication difficulties on this date. Per PT pt was Min A for Sup to sit with pt exherting great effort getting EOB. Needed assist for RUE and to maintain seated balance.   Transfers                 General transfer comment: Deferred. Per PT pt +2 phsical assist for standing.     Balance Overall balance assessment: Needs assistance Sitting-balance support: Single extremity supported(Unable to use RUE for balance) Sitting balance-Leahy Scale: Poor                                     ADL either performed or assessed with clinical judgement   ADL Overall ADL's : Needs assistance/impaired                                       General ADL Comments: ADL not formally assessed on this date. However pt with significant right sided weakness limiting functional use of RUE and RLE. Suspect mod/max assist for ADL and functional mobility at this time.      Vision Baseline Vision/History: Wears glasses Wears Glasses: Reading only;Distance only Patient Visual Report: No change from baseline(Difficult to assess due to communication difficulties.)       Perception     Praxis      Pertinent  Vitals/Pain Pain Assessment: No/denies pain     Hand Dominance Right   Extremity/Trunk Assessment Upper Extremity Assessment Upper Extremity Assessment: RUE deficits/detail(LUE WFL grossly 4/5.) RUE Deficits / Details: No palpable contraction or voluntary movement of RUE observed. Family indicates RUE function appears to have declined further from baseline, since yesterday. Pt did not indicate pain with PROM through full range. RUE appears slightly swollen/edematous compated to LUE.  RUE Sensation: decreased proprioception RUE Coordination: decreased fine motor;decreased gross motor   Lower Extremity Assessment Lower Extremity Assessment: Generalized weakness;Defer to PT evaluation;RLE deficits/detail RLE Deficits / Details: Pt  able to wiggle toes when prompted. Palpable contraction when asked to bend knee, however movement limited on this date. Sensation difficulty to assess 2/2 communication difficulties.  RLE Coordination: decreased gross motor       Communication Communication Communication: Expressive difficulties(Pt unable to speak, inconsistently able to answer yes/no questions. Pt has communication board in room but at time of this evaluation had difficulty with comm board use, became tearful when unable to express himself. )   Cognition Arousal/Alertness: Awake/alert Behavior During Therapy: (Unable to speak, but apprpriately interactive as able.) Overall Cognitive Status: Difficult to assess                                 General Comments:  Family unable to determine if at baseline cognition due to communication difficulties. Pt able to answer some yes/no questions and vollow verbal prompts on this date.    General Comments  MD in to speak with family at time of evaluation. Pt wife indicated pt will be going home to recieve hospice care. Wife tearful about this. OT provided active listening and reassurance.    Exercises Other Exercises Other Exercises: Educated pt and caregivers on appropriate positioning and PROM of RUE in order to increase safety and decrease risk of skin breakdwon in the affected side.   Shoulder Instructions      Home Living Family/patient expects to be discharged to:: Private residence Living Arrangements: Spouse/significant other Available Help at Discharge: Family;Available 24 hours/day Type of Home: House Home Access: Other (comment)(Level entry with slight threshold at door.)     Home Layout: One level;Laundry or work area in basement     [employer] Shower/Tub: [facility];Door   [employer] Toilet: Standard Bathroom Accessibility: Yes How Accessible: Accessible via walker;Other (comment)(Wife unsure if accessable via wheelchair) Home Equipment: Cane -  single point   Additional Comments: Pt generally ambulating independently. occasionally uses SPC.      Prior Functioning/Environment Level of Independence: Independent        Comments: Pt drives, is out of the home QD, able to be active        OT Problem List: Decreased strength;Impaired balance (sitting and/or standing);Decreased range of motion;Decreased safety awareness;Decreased knowledge of use of DME or AE;Decreased coordination;Decreased activity tolerance;Impaired sensation;Impaired UE functional use      OT Treatment/Interventions: Self-care/ADL training;Therapeutic exercise;Neuromuscular education;Therapeutic activities;Energy conservation;DME and/or AE instruction;Cognitive remediation/compensation;Patient/family education;Balance training    OT Goals(Current goals can be found in the care plan section) Acute Rehab OT Goals Patient Stated Goal: unable to state, family would like to take him home OT Goal Formulation: With patient/family Time For Goal Achievement: 10/19/18 Potential to Achieve Goals: Good ADL Goals Pt Will Perform Eating: with supervision;with adaptive utensils(Pt will perform feeding when medically appropriate with LRAD (i.e. built up handle, etc.) for safety and imroved functional independence.) Pt Will Perform  Grooming: with set-up;with adaptive equipment(With LRAD for safety and improved functional independence.)  OT Frequency: Min 2X/week   Barriers to D/C:            Co-evaluation              AM-PAC OT "6 Clicks" Daily Activity     Outcome Measure Help from another person eating meals?: A Lot Help from another person taking care of personal grooming?: A Lot Help from another person toileting, which includes using toliet, bedpan, or urinal?: A Lot Help from another person bathing (including washing, rinsing, drying)?: A Lot Help from another person to put on and taking off regular upper body clothing?: A Lot Help from another person to  put on and taking off regular lower body clothing?: A Lot 6 Click Score: 12   End of Session    Activity Tolerance: Patient tolerated treatment well Patient left: in bed;with call bell/phone within reach;with bed alarm set;with family/visitor present  OT Visit Diagnosis: Unsteadiness on feet (R26.81);Other abnormalities of gait and mobility (R26.89);History of falling (Z91.81);Other symptoms and signs involving the nervous system (R29.898);Hemiplegia and hemiparesis Hemiplegia - Right/Left: Right Hemiplegia - dominant/non-dominant: Dominant Hemiplegia - caused by: Cerebral infarction                Time: 2902-1115 OT Time Calculation (min): 46 min Charges:  OT General Charges $OT Visit: 1 Visit OT Evaluation $OT Eval High Complexity: 1 High OT Treatments $Self Care/Home Management : 8-22 mins  Shara Blazing, M.S., OTR/L Ascom: [phone] 10/05/18, 11:33 AM

## 2018-10-05 NOTE — Progress Notes (Signed)
SLP Cancellation Note  Patient Details Name: Marcus Cruz. MRN: [id number] DOB: 1920-12-10   Cancelled treatment:       Reason Eval/Treat Not Completed: (chart reviewed; consulted Palliative Care, family) . Pt/family had just met w/ Palliative Care services and were not ready for SLP to perform BSE at the time. ST services will f/u later today w/ assessment, education. Family agreed.   Orinda Kenner, MS, CCC-SLP Watson,Katherine 10/05/2018, 11:04 AM

## 2018-10-05 NOTE — Progress Notes (Signed)
Nutrition Brief Note  Pt screened as positive MST.  Chart reviewed. Pt transitioning home with hospice on comfort feeds.  No further nutrition interventions warranted at this time.  Please rconsult as needed.    Gaynell Face, MS, RD, LDN Inpatient Clinical Dietitian Pager: [phone] Weekend/After Hours: [phone]

## 2018-10-05 NOTE — Progress Notes (Signed)
[facility] [place] NAME: Marcus Cruz    MR#:  [id number]  DATE OF BIRTH:  April 24, 1921  SUBJECTIVE:  CHIEF COMPLAINT:   Chief Complaint  Patient presents with  . Altered Mental Status    REVIEW OF SYSTEMS:  ROS  DRUG ALLERGIES:  No Known Allergies VITALS:  Blood pressure (!) 109/59, pulse 87, temperature 97.7 F (36.5 C), temperature source Oral, resp. rate 20, height 6' (1.829 m), weight (!) 136.7 kg, SpO2 96 %. PHYSICAL EXAMINATION:  Physical Exam LABORATORY PANEL:  Male CBC Recent Labs  Lab 10/04/18 0818  WBC 11.1*  HGB 10.2*  HCT 32.1*  PLT 111*   ------------------------------------------------------------------------------------------------------------------ Chemistries  Recent Labs  Lab 10/04/18 0818  NA 138  K 4.8  CL 109  CO2 23  GLUCOSE 95  BUN 39*  CREATININE 1.75*  CALCIUM 8.6*  AST 95*  ALT 47*  ALKPHOS 107  BILITOT 1.0   RADIOLOGY:  Ct Head Wo Contrast  Result Date: 10/05/2018 CLINICAL DATA:  Decreased level of consciousness EXAM: CT HEAD WITHOUT CONTRAST TECHNIQUE: Contiguous axial images were obtained from the base of the skull through the vertex without intravenous contrast. COMPARISON:  10/04/2018 FINDINGS: Brain: Mild atrophic changes and chronic white matter ischemic changes are seen. A more focal area of decreased attenuation is noted in the left parietal lobe best seen on images 26 and 25 of series 3 consistent with acute to subacute ischemia. This was not well appreciated on the prior exam. No other areas of abnormal attenuation are seen. No findings to suggest acute hemorrhage or space-occupying mass lesion are noted. Vascular: No hyperdense vessel or unexpected calcification. Skull: Normal. Negative for fracture or focal lesion. Sinuses/Orbits: Mucosal changes are noted within the right maxillary antrum and sphenoid sinus stable from the previous exam. No orbital abnormality is noted. Other:  None IMPRESSION: New area of decreased attenuation within the left parietal lobe consistent with evolving acute to subacute ischemia. Stable mucosal changes within the paranasal sinuses. Chronic changes as described above stable from the previous exam. These results will be called to the ordering clinician or representative by the Radiologist Assistant, and communication documented in the PACS or zVision Dashboard. Electronically Signed   By: Inez Catalina M.D.   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   *TIA/CVA : initial CT head showed no acute intracranial abnormality. Unable to obtain MRI brain due to pacemaker. Repeat CT head reviewed and shows new area of decreased attenuation within the left parietal lobe consistent with evolving acute to subacute ischemia.    * Acute cystitis * Acute sinusitis * Acute kidney injury on chronic kidney disease stage III * Lung nodules-granulomatous versus mets * Elevated troponin due to demand ischemia * Essential hypertension    Per patient's wishes and discussion with family, they wish to proceed with comfort measures. Family has decided on Hospice at home through [facility].  Likely discharge tomorrow   All the records are reviewed and case discussed with Care Management/Social Worker. Management plans discussed with the patient, family (wife, stepson and other family members at bedside) and they are in agreement.  CODE STATUS: DNR/comfort care  TOTAL TIME TAKING CARE OF THIS PATIENT: 35 minutes.   More than  50% of the time was spent in counseling/coordination of care: YES  POSSIBLE D/C IN 1 DAYS, DEPENDING ON CLINICAL CONDITION.   Max Sane M.D  on 10/05/2018 at 1:18 PM  Between 7am to 6pm - Pager - [phone]  After 6pm go to [email or web address] - [employer] [facility]  Office  [phone]  CC: Primary care physician; Kirk Ruths, MD  Note: This dictation was prepared with Dragon dictation along with smaller phrase technology. Any transcriptional errors that result from this process are unintentional.

## 2018-10-05 NOTE — Plan of Care (Signed)
  Problem: Education: Goal: Knowledge of General Education information will improve Description: Including pain rating scale, medication(s)/side effects and non-pharmacologic comfort measures Outcome: Progressing   Problem: Clinical Measurements: Goal: Ability to maintain clinical measurements within normal limits will improve Outcome: Progressing Goal: Will remain free from infection Outcome: Progressing Goal: Diagnostic test results will improve Outcome: Progressing Goal: Cardiovascular complication will be avoided Outcome: Progressing   Problem: Activity: Goal: Risk for activity intolerance will decrease Outcome: Progressing   Problem: Coping: Goal: Level of anxiety will decrease Outcome: Progressing   Problem: Elimination: Goal: Will not experience complications related to bowel motility Outcome: Progressing Goal: Will not experience complications related to urinary retention Outcome: Progressing   Problem: Pain Managment: Goal: General experience of comfort will improve Outcome: Progressing   Problem: Safety: Goal: Ability to remain free from injury will improve Outcome: Progressing   Problem: Skin Integrity: Goal: Risk for impaired skin integrity will decrease Outcome: Progressing   

## 2018-10-05 NOTE — Clinical Social Work Note (Signed)
CSW consulted for SNF placement. Per Palliative NP and MD patient will go home with hospice services. CSW signing off. Please re consult if further needs arise.   [place], [place]

## 2018-10-05 NOTE — Progress Notes (Addendum)
PT Cancellation Note  Patient Details Name: Marcus Cruz. MRN: [id number] DOB: Feb 01, 1921   Cancelled Treatment:    Reason Eval/Treat Not Completed: Other (comment). Treatment attempted; multiple family members and other staff in room. Staff requested possible attempt at a later time. Per chart review, family has chosen to go home with hospice. Frequency may need change if PT order remains.   PT order now canceled, and acknowledged.    Larae Grooms, PTA 10/05/2018, 12:38 PM

## 2018-10-05 NOTE — Progress Notes (Addendum)
Subjective: Patient awake and alert this morning. He is noted to have significant aphasia and inability to verbalize. He is however able to follow simple commands such as holding his arm, sticking out his tongue and showing his thumb. No new stroke like symptoms reported. Remains weak on the right. Family currently at the bedside.  Objective: Current vital signs: BP (!) 109/59   Pulse 87   Temp 97.7 F (36.5 C) (Oral)   Resp 20   Ht 6' (1.829 m)   Wt (!) 136.7 kg   SpO2 96%   BMI 40.87 kg/m  Vital signs in last 24 hours: Temp:  [97.7 F (36.5 C)-100.1 F (37.8 C)] 97.7 F (36.5 C) (01/29 0449) Pulse Rate:  [75-102] 87 (01/29 0449) Resp:  [18-20] 20 (01/29 0449) BP: (109-129)/(48-59) 109/59 (01/29 0449) SpO2:  [92 %-98 %] 96 % (01/29 0449) Weight:  [136.7 kg] 136.7 kg (01/28 1300)  Intake/Output from previous day: 01/28 0701 - 01/29 0700 In: 100.5 [I.V.:0.5; IV Piggyback:100] Out: 300 [Urine:300] Intake/Output this shift: No intake/output data recorded. Nutritional status:  Diet Order    None     Neurological Exam  Mental Status: Alert, unable to assess orientation as patient is aphasic. Able to follow 3 step commands without difficulty. Able to show thumb and stick tongue to commands. Attention span and concentration seemed appropriate  Cranial Nerves: II: Discs flat bilaterally; Visual fields grossly normal, pupils equal, round, reactive to light and accommodation III,IV, VI: ptosis not present, extra-ocular motions intact bilaterally V,VII: smile symmetric, facial light touch sensationintact VIII: hearing normal bilaterally IX,X: gag reflex present XI: bilateral shoulder shrug XII: midline tongue extension Motor: Right :Upper extremity 1/5does not break gravityLeft: Upper extremity 5/5 without pronator drift Right:Lower extremity 1/5able to withdraw but does not break gravityLeft: Lower extremity  3+/5 Tone and bulk:normal tone throughout; no atrophy noted Sensory: Pinprick and light touchintact bilaterally Deep Tendon Reflexes: 2+ and symmetric throughout Plantars: Right:muteLeft: mute Cerebellar: Unable to assess  Gait: not tested due to safety concerns  Data Reviewed   Lab Results: Basic Metabolic Panel: Recent Labs  Lab 10/04/18 0818  NA 138  K 4.8  CL 109  CO2 23  GLUCOSE 95  BUN 39*  CREATININE 1.75*  CALCIUM 8.6*    Liver Function Tests: Recent Labs  Lab 10/04/18 0818  AST 95*  ALT 47*  ALKPHOS 107  BILITOT 1.0  PROT 6.2*  ALBUMIN 2.9*   No results for input(s): LIPASE, AMYLASE in the last 168 hours. No results for input(s): AMMONIA in the last 168 hours.  CBC: Recent Labs  Lab 10/04/18 0818  WBC 11.1*  NEUTROABS 6.2  HGB 10.2*  HCT 32.1*  MCV 96.7  PLT 111*    Cardiac Enzymes: Recent Labs  Lab 10/04/18 0818 10/04/18 1143 10/04/18 1715 10/04/18 2332 10/05/18 0500  TROPONINI 0.36* 0.34* 0.37* 0.38* 0.33*    Lipid Panel: Recent Labs  Lab 10/05/18 0500  CHOL 94  TRIG 76  HDL 27*  CHOLHDL 3.5  VLDL 15  LDLCALC 52    CBG: No results for input(s): GLUCAP in the last 168 hours.  Microbiology: Results for orders placed or performed during the hospital encounter of 10/04/18  Urine culture     Status: None (Preliminary result)   Collection Time: 10/04/18  9:29 AM  Result Value Ref Range Status   Specimen Description   Final    URINE, RANDOM Performed at [facility], [address], [place], [place] [address]  Special Requests   Final    NONE Performed at [facility], [address], [place], [place] [address]    Culture   Final    CULTURE REINCUBATED FOR BETTER GROWTH Performed at [facility], [place] [address], [place], [place] [address]    Report Status PENDING  Incomplete    Coagulation Studies: Recent Labs    10/04/18 0818  LABPROT 13.3  INR  1.02    Imaging: Ct Head Wo Contrast  Result Date: 10/05/2018 CLINICAL DATA:  Decreased level of consciousness EXAM: CT HEAD WITHOUT CONTRAST TECHNIQUE: Contiguous axial images were obtained from the base of the skull through the vertex without intravenous contrast. COMPARISON:  10/04/2018 FINDINGS: Brain: Mild atrophic changes and chronic white matter ischemic changes are seen. A more focal area of decreased attenuation is noted in the left parietal lobe best seen on images 26 and 25 of series 3 consistent with acute to subacute ischemia. This was not well appreciated on the prior exam. No other areas of abnormal attenuation are seen. No findings to suggest acute hemorrhage or space-occupying mass lesion are noted. Vascular: No hyperdense vessel or unexpected calcification. Skull: Normal. Negative for fracture or focal lesion. Sinuses/Orbits: Mucosal changes are noted within the right maxillary antrum and sphenoid sinus stable from the previous exam. No orbital abnormality is noted. Other: None IMPRESSION: New area of decreased attenuation within the left parietal lobe consistent with evolving acute to subacute ischemia. Stable mucosal changes within the paranasal sinuses. Chronic changes as described above stable from the previous exam. These results will be called to the ordering clinician or representative by the Radiologist Assistant, and communication documented in the PACS or zVision Dashboard. Electronically Signed   By: Inez Catalina M.D.   On: 10/05/2018 10:38   Ct Head Wo Contrast  Result Date: 10/04/2018 CLINICAL DATA:  Difficulty speaking. EXAM: CT HEAD WITHOUT CONTRAST TECHNIQUE: Contiguous axial images were obtained from the base of the skull through the vertex without intravenous contrast. COMPARISON:  CT scan of March 08, 2013. FINDINGS: Brain: Mild chronic ischemic white matter disease is noted. No mass effect or midline shift is noted. Ventricular size is within normal limits. There is no  evidence of mass lesion, hemorrhage or acute infarction. Vascular: No hyperdense vessel or unexpected calcification. Skull: Normal. Negative for fracture or focal lesion. Sinuses/Orbits: Mild right maxillary and sphenoid sinusitis is noted. Other: None. IMPRESSION: Mild chronic ischemic white matter disease. Mild right maxillary and sphenoid sinusitis. No acute intracranial abnormality seen. Electronically Signed   By: Marijo Conception, M.D.   On: 10/04/2018 09:11   Dg Chest Portable 1 View  Result Date: 10/04/2018 CLINICAL DATA:  Weakness. EXAM: PORTABLE CHEST 1 VIEW COMPARISON:  03/04/2017. FINDINGS: Cardiac pacer with lead tips over the right atrium right ventricle. Heart size normal. No pulmonary venous congestion. New nodular densities are noted over both lungs. Lower nodular densities may represent nipple shadows however other nodular densities are concerning for an active process including granulomas disease or metastatic disease. CT of the chest can be obtained to further evaluate. No pleural effusion or pneumothorax. Degenerative change thoracic spine. IMPRESSION: 1. Cardiac pacer with lead tips over the right atrium right ventricle. Heart size normal. No pulmonary venous congestion. 2. New nodular densities are noted over the lungs. These could represent a process such as granulomas or metastatic lesions. Nonenhanced CT of the chest can be obtained to further evaluate. Electronically Signed   By: Marcello Moores  Register  On: 10/04/2018 09:11    Medications:  I have reviewed the patient's current medications. Prior to Admission:  Medications Prior to Admission  Medication Sig Dispense Refill Last Dose  . aspirin EC 81 MG tablet Take 81 mg by mouth daily.   10/03/2018 at 0800  . feeding supplement, ENSURE ENLIVE, (ENSURE ENLIVE) LIQD Take 237 mLs by mouth 2 (two) times daily between meals. 237 mL 12 Past Month at Unknown time  . metoprolol succinate (TOPROL-XL) 25 MG 24 hr tablet Take 25 mg by mouth  daily.   10/03/2018 at 0800   Scheduled: . enoxaparin (LOVENOX) injection  30 mg Subcutaneous Q24H  . Influenza vac split quadrivalent PF  0.5 mL Intramuscular Tomorrow-1000   Patient seen and examined.  Clinical course and management discussed.  Necessary edits performed.  I agree with the above.  Assessment and plan of care developed and discussed below.     Assessment: 85 y.o. 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 Acute infarct with embolic etiology suspected. Presented outside window period for tPA. Initial CT head showed no acute intracranial abnormality. Unable to obtain MRI brain due to pacemaker. Repeat CT head reviewed and shows new area of decreased attenuation within the left parietal lobe consistent with evolving acute to subacute ischemia.  Per patient's wishes and discussion with family, they wish to proceed with comfort measures. Of note he has hx of left thigh sarcoma and had refused biopsy or other aggressive measures for treatment of this as well. Family has decided on Hospice at home through [facility]  Recommendations: 1. Agree with palliative consult and disposition. 2. Patient to continue ASA daily  This patient was staffed with Dr. Magda Paganini, [place] Mince who personally evaluated patient, reviewed documentation and agreed with assessment and plan of care as above.  Rufina Falco, DNP, FNP-BC Board certified Nurse Practitioner Neurology Department    LOS: 1 day   10/05/2018  11:59 AM  No further neurologic intervention is recommended at this time.  If further questions arise, please call or page at that time.  Thank you for allowing neurology to participate in the care of this patient.  Alexis Goodell,  MD Neurology [phone] 10/05/2018  12:41 PM

## 2018-10-05 NOTE — Progress Notes (Signed)
*  PRELIMINARY RESULTS* Echocardiogram 2D Echocardiogram has been performed.  Marcus Cruz [name] 10/05/2018, 8:20 AM

## 2018-10-05 NOTE — Progress Notes (Signed)
OT Cancellation Note  Patient Details Name: Marcus Cruz. MRN: [id number] DOB: 06/27/1921   Cancelled Treatment:    Reason Eval/Treat Not Completed: Patient at procedure or test/ unavailable. Order received. Chart reviewed. Upon arrival to floor RN notified this OT that the pt is out for testing. Will re attempt as pt available and medically appropriate.   Shara Blazing, M.S., OTR/L Ascom: [phone] 10/05/18, 9:17 AM

## 2018-10-06 LAB — URINE CULTURE

## 2018-10-06 MED ORDER — LORAZEPAM 0.5 MG PO TABS: 0.5000 mg | ORAL_TABLET | ORAL | 0 refills | Status: DC | PRN

## 2018-10-06 MED ORDER — MORPHINE SULFATE (CONCENTRATE) 10 MG /0.5 ML PO SOLN: 5.0000 mg | ORAL | 0 refills | Status: DC | PRN

## 2018-10-08 NOTE — Discharge Instructions (Signed)
Hospice °Hospice is a service that is designed to provide people who are terminally ill and their families with medical, spiritual, and psychological support. Its aim is to improve your quality of life by keeping you as comfortable as possible in the final stages of life. °Who will be my providers when I begin hospice care? °Hospice teams often include: °· A nurse. °· A doctor. The hospice doctor will be available for your care, but you can include your regular doctor or nurse practitioner. °· A social worker. °· A counselor. °· A religious leader (such as a chaplain). °· A dietitian. °· Therapists. °· Trained volunteers who can help with care. °What services does hospice provide? °Hospice services can vary depending on the center or organization. Generally, they include: °· Ways to keep you comfortable, such as: °? Providing care in your home or in a home-like setting. °? Working with your family and friends to help meet your needs. °? Allowing you to enjoy the support of loved ones by receiving much of your basic care from family and friends. °· Pain relief and symptom management. The staff will supply all necessary medicines and equipment so that you can stay comfortable and alert enough to enjoy the company of your friends and family. °· Visits or care from a nurse and doctor. This may include 24-hour on-call services. °· Companionship when you are alone. °· Allowing you and your family to rest. Hospice staff may do light housekeeping, prepare meals, and run errands. °· Counseling. They will make sure your emotional, spiritual, and social needs are being met, as well as those needs of your family members. °· Spiritual care. This will be individualized to meet your needs and your family's needs. It may involve: °? Helping you and your family understand the dying process. °? Helping you say goodbye to your family and friends. °? Performing a specific religious ceremony or ritual. °· Massage. °· Nutrition  therapy. °· Physical and occupational therapy. °· Short-term inpatient care, if something cannot be managed in the home. °· Art or music therapy. °· Bereavement support for grieving family members. °When should hospice care begin? °Most people who use hospice are believed to have less than 6 months to live. °· Your family and health care providers can help you decide when hospice services should begin. °· If you live longer than 6 months but your condition does not improve, your doctor may be able to approve you for continued hospice care. °· If your condition improves, you may discontinue the program. °What should I consider before selecting a program? °Most hospice programs are run by nonprofit, independent organizations. Some are affiliated with hospitals, nursing homes, or home health care agencies. Hospice programs can take place in your home or at a hospice center, hospital, or skilled nursing facility. When choosing a hospice program, ask the following questions: °· What services are available to me? °· What services will be offered to my loved ones? °· How involved will my loved ones be? °· How involved will my health care provider be? °· Who makes up the hospice care team? How are they trained or screened? °· How will my pain and symptoms be managed? °· If my circumstances change, can the services be provided in a different setting, such as my home or in the hospital? °· Is the program reviewed and licensed by the state or certified in some other way? °· What does it cost? Is it covered by insurance? °· If I choose a hospice   center or nursing home, where is the hospice center located? Is it convenient for family and friends? °· If I choose a hospice center or nursing home, can my family and friends visit any time? °· Will you provide emotional and spiritual support? °· Who can my family call with questions? °Where can I learn more about hospice? °You can learn about existing hospice programs in your area  from your health care providers. You can also read more about hospice online. The websites of the following organizations have helpful information: °· National Hospice and Palliative Care Organization (NHPCO): www.nhpco.org °· National Association for Home Care & Hospice (NAHC): www.nahc.org °· Hospice Foundation of [employer] (HFA): www.hospicefoundation.org °· American Cancer Society (ACS): www.cancer.org °· Hospice Net: www.hospicenet.org °· Visiting Nurse Associations of [employer] (VNAA): www.vnaa.org °You may also find more information by contacting the following agencies: °· A local agency on aging. °· Your local [employer] chapter. °· Your state's department of health or social services. °Summary °· Hospice is a service that is designed to provide people who are terminally ill and their families with medical, spiritual, and psychological support. °· Hospice aims to improve your quality of life by keeping you as comfortable as possible in the final stages of life. °· Hospice teams often include a doctor, nurse, social worker, counselor, religious leader,dietitian, therapists, and volunteers. °· Hospice care generally includes medicine for symptom management, visits from doctors and nurses, physical and occupational therapy, nutrition counseling, spiritual and emotional counseling, caregiver support, and bereavement support for grieving family members. °· Hospice programs can take place in your home or at a hospice center, hospital, or skilled nursing facility. °This information is not intended to replace advice given to you by your health care provider. Make sure you discuss any questions you have with your health care provider. °Document Released: 12/11/2003 Document Revised: 09/15/2016 Document Reviewed: 09/15/2016 °Elsevier Interactive Patient Education © 2019 [employer] ° °

## 2018-10-08 NOTE — Discharge Summary (Signed)
[facility] NAME: Marcus Cruz    MR#:  [id number]  DATE OF BIRTH:  [birth year]  DATE OF ADMISSION:  10/04/2018   ADMITTING PHYSICIAN: Nicholes Mango, MD  DATE OF DISCHARGE: 09/20/2018  PRIMARY CARE PHYSICIAN: Kirk Ruths, MD   ADMISSION DIAGNOSIS:  Aphasia [R47.01] Dehydration [E86.0] TIA (transient ischemic attack) [G45.9] Elevated troponin [R79.89] Urinary tract infection without hematuria, site unspecified [N39.0] DISCHARGE DIAGNOSIS:  Active Problems:   UTI (urinary tract infection)   Aphasia   Goals of care, counseling/discussion   Palliative care by specialist  SECONDARY DIAGNOSIS:   Past Medical History:  Diagnosis Date  . Cancer [facility]) 2010   sarcoma resected from Left thigh.  . 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   *TIA/CVA : initialCT headshowedno acute intracranial abnormality. Unable to obtain MRI brain due to pacemaker. Repeat CT head reviewed and shows new area of decreased attenuation within the left parietal lobe consistent with evolving acute to subacute ischemia.  * Acute cystitis * Acute sinusitis * Acute kidney injury on chronic kidney disease stage III * Lung nodules-granulomatous versus mets * Elevated troponin due to demand ischemia * Essential hypertension   Patient actively dying. Prognosis < 2 weeks. Appropriate for Hospice Home. Family in agreement. DISCHARGE CONDITIONS:  Critical - actively dying CONSULTS OBTAINED:  Treatment Team:  Catarina Hartshorn, MD DRUG ALLERGIES:  No Known Allergies DISCHARGE MEDICATIONS:   Allergies as of 09/30/2018   No Known Allergies     Medication List    STOP  taking these medications   aspirin EC 81 MG tablet   feeding supplement (ENSURE ENLIVE) Liqd   metoprolol succinate 25 MG 24 hr tablet Commonly known as:  TOPROL-XL     TAKE these medications   LORazepam 0.5 MG tablet Commonly known as:  ATIVAN Take 1 tablet (0.5 mg total) by mouth every 4 (four) hours as needed for anxiety.   morphine CONCENTRATE 10 mg / 0.5 ml concentrated solution Take 0.25 mLs (5 mg total) by mouth every 2 (two) hours as needed for severe pain, anxiety or shortness of breath.        DISCHARGE INSTRUCTIONS:   DIET:  Pleasure feeding DISCHARGE CONDITION:  Serious ACTIVITY:  Activity as tolerated OXYGEN:  Home Oxygen: No.  Oxygen Delivery: room air DISCHARGE LOCATION:  Hospice Home   If you experience worsening of your admission symptoms, develop shortness of breath, life threatening emergency, suicidal or homicidal thoughts you must seek medical attention immediately by calling 911 or calling your MD immediately  if symptoms less severe.  You Must read complete instructions/literature along with all the possible adverse reactions/side effects for all the Medicines you take and that have been prescribed to you. Take any new Medicines after you have completely understood and accpet all the possible adverse reactions/side effects.   Please note  You were cared for by a hospitalist during your hospital stay. If you have any questions about your discharge medications or the care you received while you were in the hospital after you are discharged, you can call the unit and asked to speak with the hospitalist on call if the hospitalist that took care of you is not available. Once you are discharged, your primary care  physician will handle any further medical issues. Please note that NO REFILLS for any discharge medications will be authorized once you are discharged, as it is imperative that you return to your primary care physician (or establish a relationship  with a primary care physician if you do not have one) for your aftercare needs so that they can reassess your need for medications and monitor your lab values.    On the day of Discharge:  VITAL SIGNS:  Blood pressure (!) (P) 116/37, pulse (P) 62, temperature (P) 97.9 F (36.6 C), temperature source (P) Axillary, resp. rate 20, height 6' (1.829 m), weight 67.6 kg, SpO2 (P) 100 %. PHYSICAL EXAMINATION:  GENERAL:  85 y.o.-year-old patient lying in the bed with no acute distress. unable to assess orientation as patient is aphasic EYES: Pupils equal, round, reactive to light and accommodation. No scleral icterus. Extraocular muscles intact.  HEENT: Head atraumatic, normocephalic. Oropharynx and nasopharynx clear.  NECK:  Supple, no jugular venous distention. No thyroid enlargement, no tenderness.  LUNGS: Normal breath sounds bilaterally, no wheezing, rales,rhonchi or crepitation. No use of accessory muscles of respiration.  CARDIOVASCULAR: S1, S2 normal. No murmurs, rubs, or gallops.  ABDOMEN: Soft, non-tender, non-distended. Bowel sounds present. No organomegaly or mass.  EXTREMITIES: No pedal edema, cyanosis, or clubbing.  NEUROLOGIC: Cranial nerves II through XII are intact. Muscle strength 5/5 in all extremities. Sensation intact. Gait not checked.  PSYCHIATRIC: The patient is unable to assess orientation as patient is aphasic SKIN: No obvious rash, lesion, or ulcer.  DATA REVIEW:   CBC Recent Labs  Lab 10/04/18 0818  WBC 11.1*  HGB 10.2*  HCT 32.1*  PLT 111*    Chemistries  Recent Labs  Lab 10/04/18 0818  NA 138  K 4.8  CL 109  CO2 23  GLUCOSE 95  BUN 39*  CREATININE 1.75*  CALCIUM 8.6*  AST 95*  ALT 47*  ALKPHOS 107  BILITOT 1.0       RADIOLOGY:  Ct Head Wo Contrast  Result Date: 10/05/2018 CLINICAL DATA:  Decreased level of consciousness EXAM: CT HEAD WITHOUT CONTRAST TECHNIQUE: Contiguous axial images were obtained from the base of the skull through the  vertex without intravenous contrast. COMPARISON:  10/04/2018 FINDINGS: Brain: Mild atrophic changes and chronic white matter ischemic changes are seen. A more focal area of decreased attenuation is noted in the left parietal lobe best seen on images 26 and 25 of series 3 consistent with acute to subacute ischemia. This was not well appreciated on the prior exam. No other areas of abnormal attenuation are seen. No findings to suggest acute hemorrhage or space-occupying mass lesion are noted. Vascular: No hyperdense vessel or unexpected calcification. Skull: Normal. Negative for fracture or focal lesion. Sinuses/Orbits: Mucosal changes are noted within the right maxillary antrum and sphenoid sinus stable from the previous exam. No orbital abnormality is noted. Other: None IMPRESSION: New area of decreased attenuation within the left parietal lobe consistent with evolving acute to subacute ischemia. Stable mucosal changes within the paranasal sinuses. Chronic changes as described above stable from the previous exam. These results will be called to the ordering clinician or representative by the Radiologist Assistant, and communication documented in the PACS or zVision Dashboard. Electronically Signed   By: Inez Catalina M.D.   On: 10/05/2018 10:38     Management plans discussed with the patient, family and they are in agreement.  CODE STATUS: DNR   TOTAL TIME TAKING CARE OF THIS PATIENT: 45 minutes.  Max Sane M.D on 09/21/2018 at 8:29 AM  Between 7am to 6pm - Pager - [phone]  After 6pm go to [email or web address] - [employer] [facility]  Office  [phone]  CC: Primary care physician; Kirk Ruths, MD   Note: This dictation was prepared with Dragon dictation along with smaller phrase technology. Any transcriptional errors that result from this process are unintentional.

## 2018-10-08 NOTE — Clinical Social Work Note (Signed)
CSW notified by hospice liaison, Santiago Glad that family has requested patient go to hospice home instead of home. Per Santiago Glad, patient can discharge to hospice home today and family is aware. Santiago Glad will set up transportation via EMS and call report.   [place], [place]

## 2018-10-08 NOTE — Progress Notes (Signed)
Follow up visit made to new referral, now for hospice home. Patient seen lying in bed, no response to verbal stimuli, wife Lelon Frohlich at bedside. Family remains in agreement for transfer to the hospice home. Patient has received IV lorazepam 1 mg over night and required placement of oxygenCondom catheter in place. Report called to the hospice home, EMS notified for transport, hospital care team and family updated. Discharge summary faxed to referral. Signed out of facility DNR in place in discharge packet. Flo Shanks RN, BSN, [facility] liaison [phone] [facility], hospital liaison [phone]

## 2018-10-08 DEATH — deceased

## 2020-05-20 IMAGING — CT CT HEAD W/O CM
3 series · 17 of 37 positions shown, 19 images · non-contrast
Comparison: CT scan of March 08, 2013.

CLINICAL DATA: Difficulty speaking.

EXAM:
CT HEAD WITHOUT CONTRAST
TECHNIQUE: Contiguous axial images were obtained from the base of the skull
through the vertex without intravenous contrast.

[Series 2: head wo · axial · 0.47mm/px · z∈[-180,-60]mm · 7 of 34 slices shown, 9 images]
[im 5/34  brain]
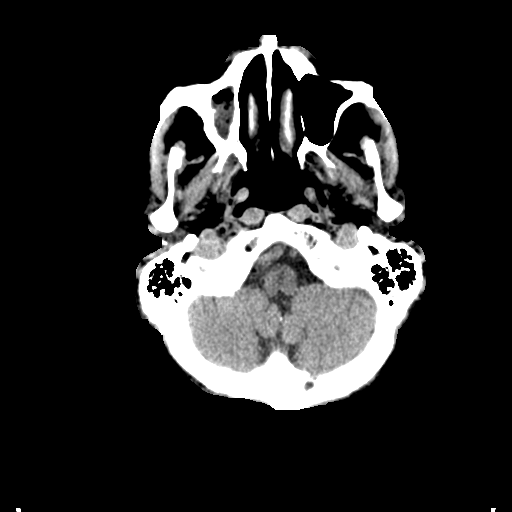
[im 5/34  bone]
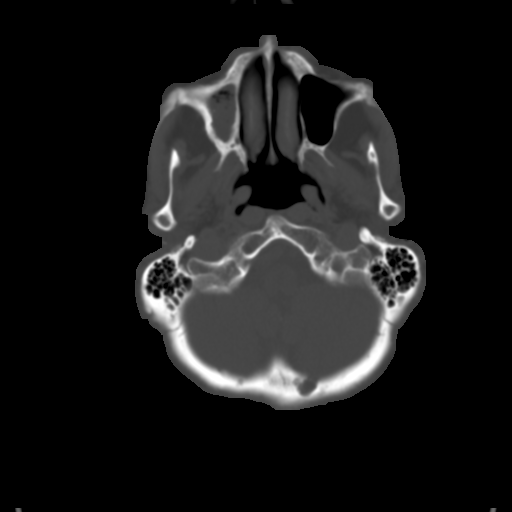
[im 9/34  brain]
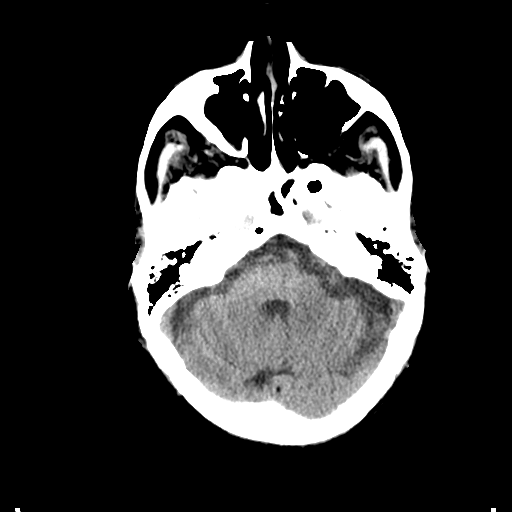
[im 13/34  brain]
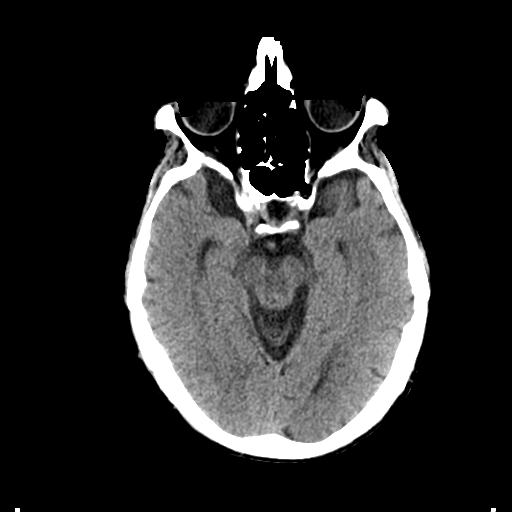
[im 17/34  brain]
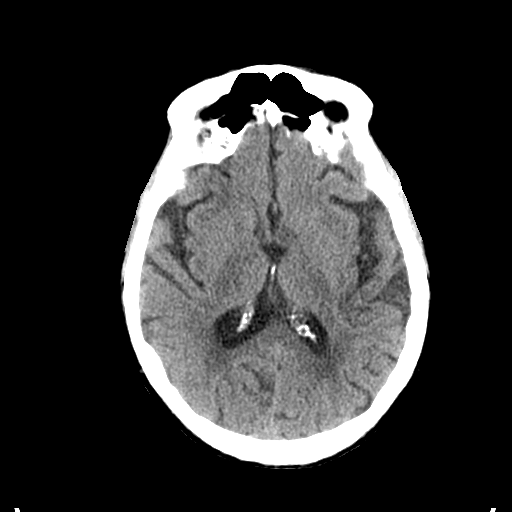
[im 21/34  brain]
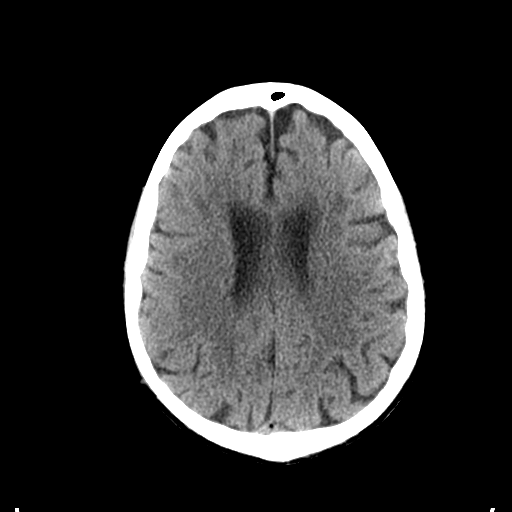
[im 21/34  bone]
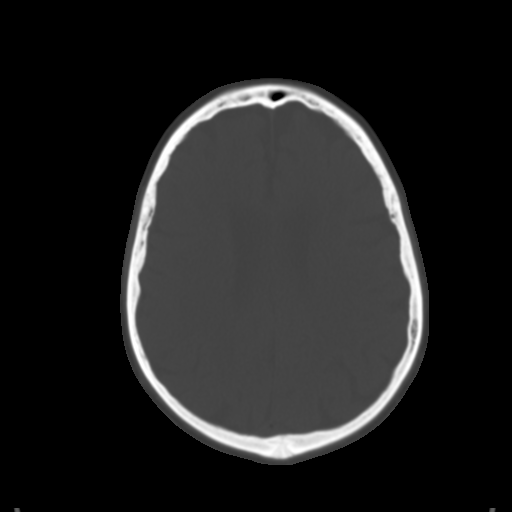
[im 25/34  brain]
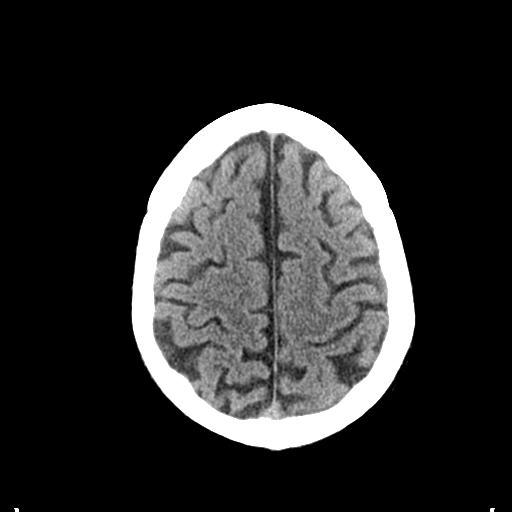
[im 29/34  brain]
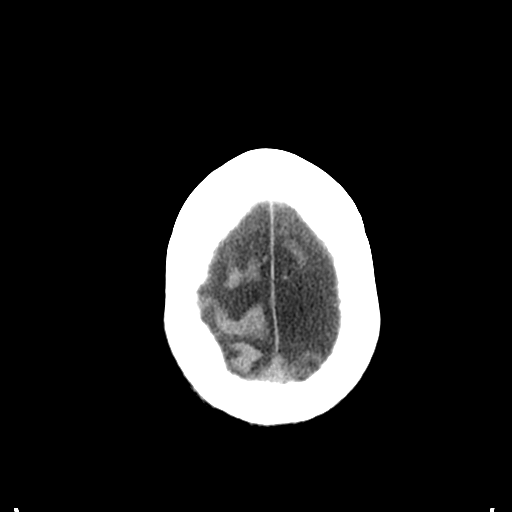

[Series 3: head bone · axial · 0.47mm/px · z∈[-184,-66]mm · 7 of 85 slices shown]
[im 9/85  bone]
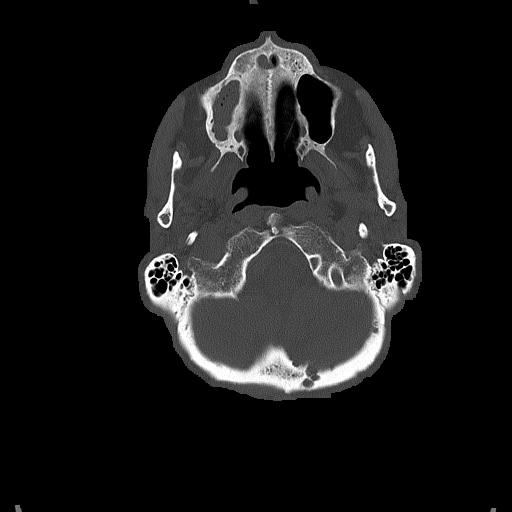
[im 17/85  bone]
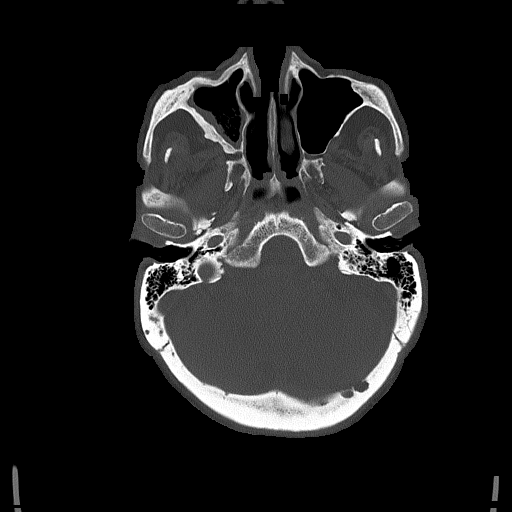
[im 26/85  bone]
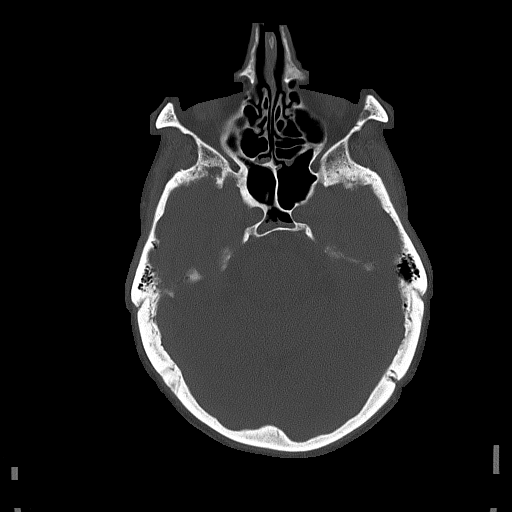
[im 38/85  bone]
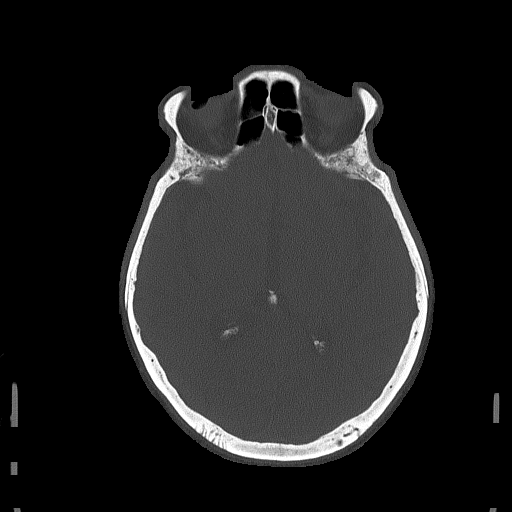
[im 47/85  bone]
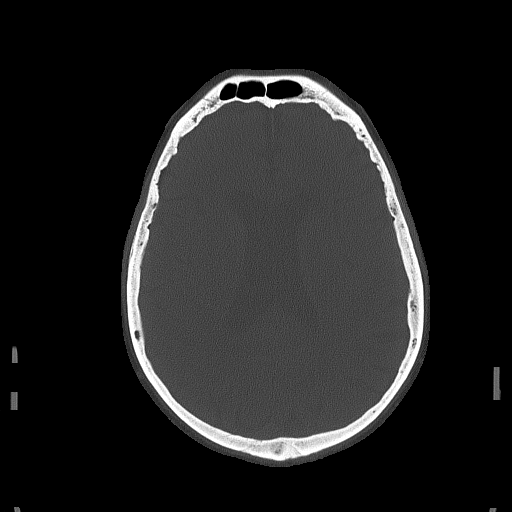
[im 59/85  bone]
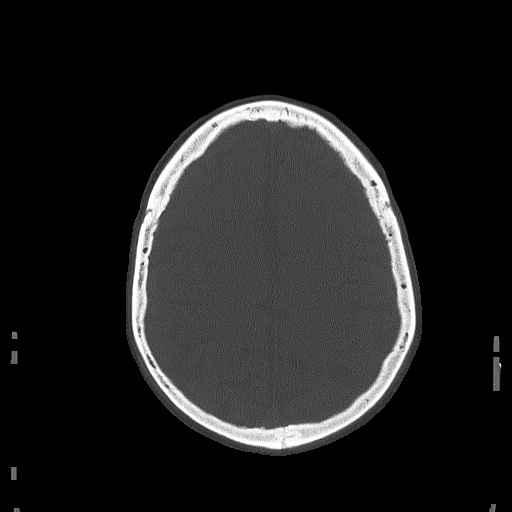
[im 68/85  bone]
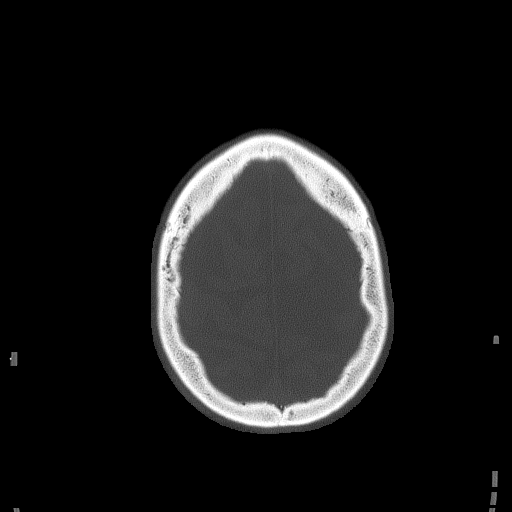

[Series 5: sagittal soft tissue · sagittal · 0.34mm/px · 3 of 52 slices shown]
[im 18/52  brain]
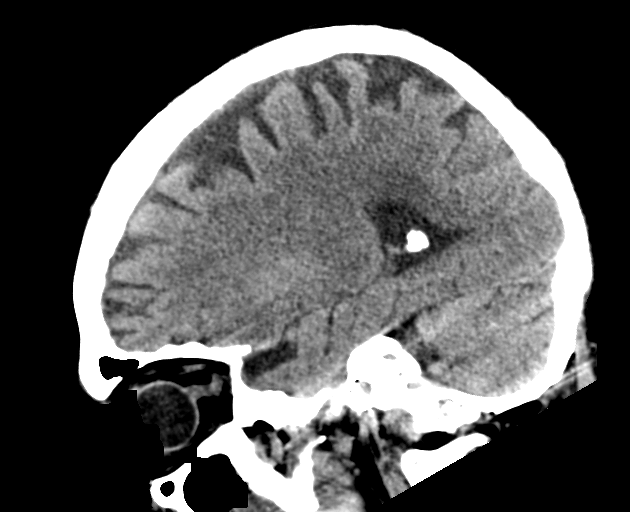
[im 26/52  brain]
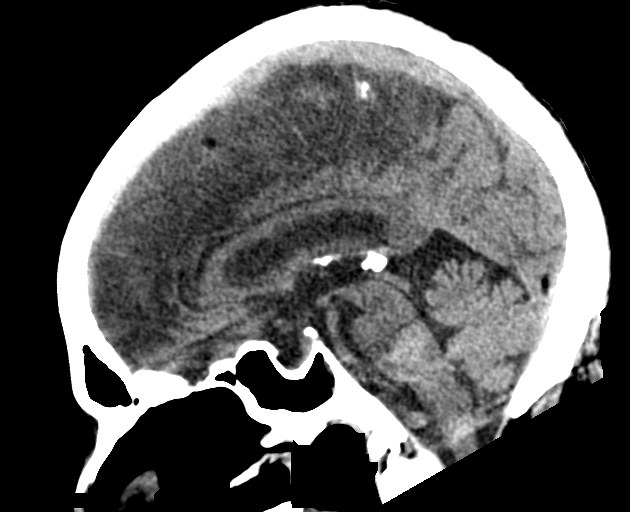
[im 35/52  brain]
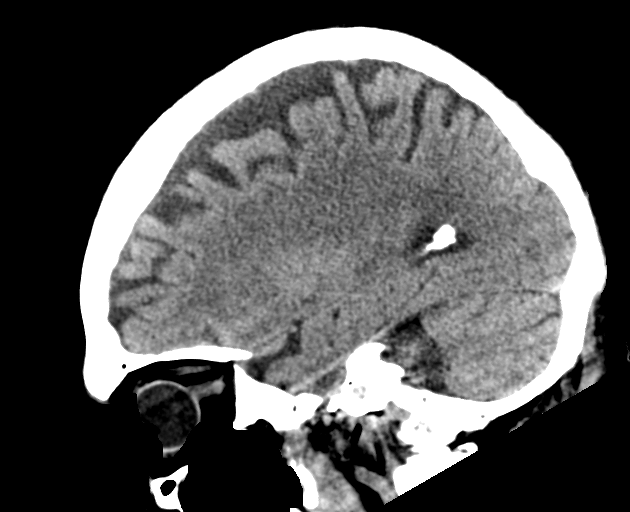

[17 of 37 positions shown; findings below may reference images not displayed]

FINDINGS: Brain: Mild chronic ischemic white matter disease is noted. No mass
effect or midline shift is noted. Ventricular size is within normal
limits. There is no evidence of mass lesion, hemorrhage or acute
infarction.

Vascular: No hyperdense vessel or unexpected calcification.

Skull: Normal. Negative for fracture or focal lesion.

Sinuses/Orbits: Mild right maxillary and sphenoid sinusitis is
noted.

Other: None.
IMPRESSION: Mild chronic ischemic white matter disease. Mild right maxillary and
sphenoid sinusitis. No acute intracranial abnormality seen.
